# Patient Record
Sex: Male | Born: 1980 | Race: Black or African American | Hispanic: No | Smoking: Former smoker
Health system: Southern US, Community
[De-identification: ages and names within clinical notes are randomized; demographics above are authoritative.]

## PROBLEM LIST (undated history)

## (undated) ENCOUNTER — Ambulatory Visit (HOSPITAL_COMMUNITY): Admission: EM | Disposition: A | Discharge status: Home / Self Care | Payer: Self-pay

## (undated) DIAGNOSIS — J45909 Unspecified asthma, uncomplicated: Secondary | ICD-10-CM

---

## 2014-06-17 ENCOUNTER — Emergency Department (HOSPITAL_BASED_OUTPATIENT_CLINIC_OR_DEPARTMENT_OTHER)
Admission: EM | Admit: 2014-06-17 | Discharge: 2014-06-17 | Disposition: A | Discharge status: Home / Self Care | Payer: Self-pay | Attending: Emergency Medicine | Admitting: Emergency Medicine

## 2014-06-17 ENCOUNTER — Encounter (HOSPITAL_BASED_OUTPATIENT_CLINIC_OR_DEPARTMENT_OTHER): Payer: Self-pay | Admitting: Emergency Medicine

## 2014-06-17 DIAGNOSIS — K5900 Constipation, unspecified: Secondary | ICD-10-CM | POA: Insufficient documentation

## 2014-06-17 DIAGNOSIS — K6289 Other specified diseases of anus and rectum: Secondary | ICD-10-CM | POA: Insufficient documentation

## 2014-06-17 DIAGNOSIS — Z720 Tobacco use: Secondary | ICD-10-CM | POA: Insufficient documentation

## 2014-06-17 MED ORDER — HYDROCORTISONE ACETATE 25 MG RE SUPP
25.0000 mg | Freq: Two times a day (BID) | RECTAL | Status: DC
  Administered 2014-06-17: 25 mg via RECTAL
  Filled 2014-06-17: qty 1

## 2014-06-17 MED ORDER — HYDROCORTISONE ACETATE 25 MG RE SUPP: 25.0000 mg | Freq: Two times a day (BID) | RECTAL | Status: DC

## 2014-06-17 NOTE — ED Notes (Signed)
MD at bedside. 

## 2014-06-17 NOTE — ED Notes (Signed)
Pt states he noticed bump on anus area

## 2014-06-17 NOTE — Discharge Instructions (Signed)
Use of fiber one cereal and products as we discussed.   Constipation Constipation is when a person:  Poops (has a bowel movement) less than 3 times a week.  Has a hard time pooping.  Has poop that is dry, hard, or bigger than normal. HOME CARE   Eat foods with a lot of fiber in them. This includes fruits, vegetables, beans, and whole grains such as brown rice.  Avoid fatty foods and foods with a lot of sugar. This includes french fries, hamburgers, cookies, candy, and soda.  If you are not getting enough fiber from food, take products with added fiber in them (supplements).  Drink enough fluid to keep your pee (urine) clear or pale yellow.  Exercise on a regular basis, or as told by your doctor.  Go to the restroom when you feel like you need to poop. Do not hold it.  Only take medicine as told by your doctor. Do not take medicines that help you poop (laxatives) without talking to your doctor first. GET HELP RIGHT AWAY IF:   You have bright red blood in your poop (stool).  Your constipation lasts more than 4 days or gets worse.  You have belly (abdominal) or butt (rectal) pain.  You have thin poop (as thin as a pencil).  You lose weight, and it cannot be explained. MAKE SURE YOU:   Understand these instructions.  Will watch your condition.  Will get help right away if you are not doing well or get worse. Document Released: 06/21/2007 Document Revised: 01/07/2013 Document Reviewed: 10/14/2012 [facility] Patient Information 2015 [place], [place]. This information is not intended to replace advice given to you by your health care provider. Make sure you discuss any questions you have with your health care provider. High-Fiber Diet Fiber is found in fruits, vegetables, and grains. A high-fiber diet encourages the addition of more whole grains, legumes, fruits, and vegetables in your diet. The recommended amount of fiber for adult males is 38 g per day. For adult females, it is  25 g per day. Pregnant and lactating women should get 28 g of fiber per day. If you have a digestive or bowel problem, ask your caregiver for advice before adding high-fiber foods to your diet. Eat a variety of high-fiber foods instead of only a select few type of foods.  PURPOSE  To increase stool bulk.  To make bowel movements more regular to prevent constipation.  To lower cholesterol.  To prevent overeating. WHEN IS THIS DIET USED?  It may be used if you have constipation and hemorrhoids.  It may be used if you have uncomplicated diverticulosis (intestine condition) and irritable bowel syndrome.  It may be used if you need help with weight management.  It may be used if you want to add it to your diet as a protective measure against atherosclerosis, diabetes, and cancer. SOURCES OF FIBER  Whole-grain breads and cereals.  Fruits, such as apples, oranges, bananas, berries, prunes, and pears.  Vegetables, such as green peas, carrots, sweet potatoes, beets, broccoli, cabbage, spinach, and artichokes.  Legumes, such split peas, soy, lentils.  Almonds. FIBER CONTENT IN FOODS Starches and Grains / Dietary Fiber (g)  Cheerios, 1 cup / 3 g  Corn Flakes cereal, 1 cup / 0.7 g  Rice crispy treat cereal, 1 cup / 0.3 g  Instant oatmeal (cooked),  cup / 2 g  Frosted wheat cereal, 1 cup / 5.1 g  Brown, long-grain rice (cooked), 1 cup / 3.5 g  White, long-grain rice (cooked), 1 cup / 0.6 g  Enriched macaroni (cooked), 1 cup / 2.5 g Legumes / Dietary Fiber (g)  Baked beans (canned, plain, or vegetarian),  cup / 5.2 g  Kidney beans (canned),  cup / 6.8 g  Pinto beans (cooked),  cup / 5.5 g Breads and Crackers / Dietary Fiber (g)  Plain or honey graham crackers, 2 squares / 0.7 g  Saltine crackers, 3 squares / 0.3 g  Plain, salted pretzels, 10 pieces / 1.8 g  Whole-wheat bread, 1 slice / 1.9 g  White bread, 1 slice / 0.7 g  Raisin bread, 1 slice / 1.2  g  Plain bagel, 3 oz / 2 g  Flour tortilla, 1 oz / 0.9 g  Corn tortilla, 1 small / 1.5 g  Hamburger or hotdog bun, 1 small / 0.9 g Fruits / Dietary Fiber (g)  Apple with skin, 1 medium / 4.4 g  Sweetened applesauce,  cup / 1.5 g  Banana,  medium / 1.5 g  Grapes, 10 grapes / 0.4 g  Orange, 1 small / 2.3 g  Raisin, 1.5 oz / 1.6 g  Melon, 1 cup / 1.4 g Vegetables / Dietary Fiber (g)  Green beans (canned),  cup / 1.3 g  Carrots (cooked),  cup / 2.3 g  Broccoli (cooked),  cup / 2.8 g  Peas (cooked),  cup / 4.4 g  Mashed potatoes,  cup / 1.6 g  Lettuce, 1 cup / 0.5 g  Corn (canned),  cup / 1.6 g  Tomato,  cup / 1.1 g Document Released: 01/02/2005 Document Revised: 07/04/2011 Document Reviewed: 04/06/2011 ExitCare Patient Information 2015 [place]. This information is not intended to replace advice given to you by your health care provider. Make sure you discuss any questions you have with your health care provider.

## 2014-06-17 NOTE — ED Provider Notes (Signed)
CSN: [id number]     Arrival date & time 06/17/14  1434 History   First MD Initiated Contact with Patient 06/17/14 1504     Chief Complaint  Patient presents with  . Hemorrhoids     HPI Patient states that he's had some pain with defecation over the last few days.  Thought he felt a bump in his anus.  Denies any bleeding.  Otherwise healthy in has no past medical history is currently on no medications.  Patient states he has been straining in his stool has been very hard over the last few weeks. History reviewed. No pertinent past medical history. History reviewed. No pertinent past surgical history. History reviewed. No pertinent family history. History  Substance Use Topics  . Smoking status: Current Every Day Smoker  . Smokeless tobacco: Not on file  . Alcohol Use: No    Review of Systems  All other systems reviewed and are negative  Allergies  Review of patient's allergies indicates no known allergies.  Home Medications   Prior to Admission medications   Medication Sig Start Date End Date Taking? Authorizing Provider  hydrocortisone (ANUSOL-HC) 25 MG suppository Place 1 suppository (25 mg total) rectally 2 (two) times daily. 06/17/14   Nelva Nayobert Wren Pryce, MD   BP 123/65 mmHg  Pulse 67  Temp(Src) 98.5 F (36.9 C) (Oral)  Resp 18  Ht 5\' 8"  (1.727 m)  Wt 175 lb (79.379 kg)  BMI 26.61 kg/m2  SpO2 100% Physical Exam  Constitutional: He is oriented to person, place, and time. He appears well-developed and well-nourished. No distress.  HENT:  Head: Normocephalic and atraumatic.  Eyes: Pupils are equal, round, and reactive to light.  Neck: Normal range of motion.  Cardiovascular: Normal rate and intact distal pulses.   Pulmonary/Chest: No respiratory distress.  Abdominal: Normal appearance. He exhibits no distension.  Genitourinary: Rectal exam shows tenderness. Rectal exam shows no external hemorrhoid, no internal hemorrhoid, no fissure and no mass.  Musculoskeletal: Normal  range of motion.  Neurological: He is alert and oriented to person, place, and time. No cranial nerve deficit.  Skin: Skin is warm and dry. No rash noted.  Psychiatric: He has a normal mood and affect. His behavior is normal.  Nursing note and vitals reviewed.   ED Course  Procedures (including critical care time) Labs Review Labs Reviewed - No data to display    MDM   Final diagnoses:  Rectal pain  Constipation, unspecified constipation type        Nelva Nayobert Adarian Bur, MD 06/17/14 1534

## 2014-06-24 ENCOUNTER — Emergency Department (HOSPITAL_BASED_OUTPATIENT_CLINIC_OR_DEPARTMENT_OTHER)
Admission: EM | Admit: 2014-06-24 | Discharge: 2014-06-24 | Disposition: A | Discharge status: Home / Self Care | Payer: Self-pay | Attending: Emergency Medicine | Admitting: Emergency Medicine

## 2014-06-24 ENCOUNTER — Encounter (HOSPITAL_BASED_OUTPATIENT_CLINIC_OR_DEPARTMENT_OTHER): Payer: Self-pay | Admitting: *Deleted

## 2014-06-24 DIAGNOSIS — Z720 Tobacco use: Secondary | ICD-10-CM | POA: Insufficient documentation

## 2014-06-24 DIAGNOSIS — Z7952 Long term (current) use of systemic steroids: Secondary | ICD-10-CM | POA: Insufficient documentation

## 2014-06-24 DIAGNOSIS — L0231 Cutaneous abscess of buttock: Secondary | ICD-10-CM | POA: Insufficient documentation

## 2014-06-24 MED ORDER — HYDROCODONE-ACETAMINOPHEN 5-325 MG PO TABS: 2.0000 | ORAL_TABLET | ORAL | Status: DC | PRN

## 2014-06-24 MED ORDER — METRONIDAZOLE 500 MG PO TABS
500.0000 mg | ORAL_TABLET | Freq: Once | ORAL | Status: AC
  Administered 2014-06-24: 500 mg via ORAL
  Filled 2014-06-24: qty 1

## 2014-06-24 MED ORDER — DOXYCYCLINE HYCLATE 100 MG PO TABS
100.0000 mg | ORAL_TABLET | Freq: Once | ORAL | Status: AC
  Administered 2014-06-24: 100 mg via ORAL
  Filled 2014-06-24: qty 1

## 2014-06-24 MED ORDER — METRONIDAZOLE 500 MG PO TABS: 500.0000 mg | ORAL_TABLET | Freq: Two times a day (BID) | ORAL | Status: DC

## 2014-06-24 MED ORDER — DOXYCYCLINE HYCLATE 100 MG PO CAPS: 100.0000 mg | ORAL_CAPSULE | Freq: Two times a day (BID) | ORAL | Status: DC

## 2014-06-24 NOTE — Discharge Instructions (Signed)
Soak and gently massage the area in a bath or shower 3 times per day for 10-20 minutes to encourage additional drainage. Recheck here with any worsening pain failure to improve over the next several days.   Abscess An abscess is an infected area that contains a collection of pus and debris.It can occur in almost any part of the body. An abscess is also known as a furuncle or boil. CAUSES  An abscess occurs when tissue gets infected. This can occur from blockage of oil or sweat glands, infection of hair follicles, or a minor injury to the skin. As the body tries to fight the infection, pus collects in the area and creates pressure under the skin. This pressure causes pain. People with weakened immune systems have difficulty fighting infections and get certain abscesses more often.  SYMPTOMS Usually an abscess develops on the skin and becomes a painful mass that is red, warm, and tender. If the abscess forms under the skin, you may feel a moveable soft area under the skin. Some abscesses break open (rupture) on their own, but most will continue to get worse without care. The infection can spread deeper into the body and eventually into the bloodstream, causing you to feel ill.  DIAGNOSIS  Your caregiver will take your medical history and perform a physical exam. A sample of fluid may also be taken from the abscess to determine what is causing your infection. TREATMENT  Your caregiver may prescribe antibiotic medicines to fight the infection. However, taking antibiotics alone usually does not cure an abscess. Your caregiver may need to make a small cut (incision) in the abscess to drain the pus. In some cases, gauze is packed into the abscess to reduce pain and to continue draining the area. HOME CARE INSTRUCTIONS   Only take over-the-counter or prescription medicines for pain, discomfort, or fever as directed by your caregiver.  If you were prescribed antibiotics, take them as directed. Finish them  even if you start to feel better.  If gauze is used, follow your caregiver's directions for changing the gauze.  To avoid spreading the infection:  Keep your draining abscess covered with a bandage.  Wash your hands well.  Do not share personal care items, towels, or whirlpools with others.  Avoid skin contact with others.  Keep your skin and clothes clean around the abscess.  Keep all follow-up appointments as directed by your caregiver. SEEK MEDICAL CARE IF:   You have increased pain, swelling, redness, fluid drainage, or bleeding.  You have muscle aches, chills, or a general ill feeling.  You have a fever. MAKE SURE YOU:   Understand these instructions.  Will watch your condition.  Will get help right away if you are not doing well or get worse. Document Released: 10/12/2004 Document Revised: 07/04/2011 Document Reviewed: 03/17/2011 [facility] Patient Information 2015 [place], [place]. This information is not intended to replace advice given to you by your health care provider. Make sure you discuss any questions you have with your health care provider.

## 2014-06-24 NOTE — ED Provider Notes (Signed)
CSN: [id number]     Arrival date & time 06/24/14  1501 History   First MD Initiated Contact with Patient 06/24/14 1513     Chief Complaint  Patient presents with  . Rectal Pain      HPI  She presents for evaluation with some bleeding and rectal pain. Seen 1 week ago. Had some perirectal pain. No obvious external hemorrhoid noted. Was given Anusol suppositories and simple pain medication. He states he was getting better for several days. Few hours going on is that he was wet and short cephalic something" popped" noticed some blood and presents here. He states there is some "brownish yellow drainage also".  History reviewed. No pertinent past medical history. History reviewed. No pertinent past surgical history. No family history on file. History  Substance Use Topics  . Smoking status: Current Every Day Smoker  . Smokeless tobacco: Not on file  . Alcohol Use: No    Review of Systems  Constitutional: Negative for fever, chills, diaphoresis, appetite change and fatigue.  HENT: Negative for mouth sores, sore throat and trouble swallowing.   Eyes: Negative for visual disturbance.  Respiratory: Negative for cough, chest tightness, shortness of breath and wheezing.   Cardiovascular: Negative for chest pain.  Gastrointestinal: Negative for nausea, vomiting, abdominal pain, diarrhea and abdominal distention.  Endocrine: Negative for polydipsia, polyphagia and polyuria.  Genitourinary: Negative for dysuria, frequency and hematuria.       Rectal pain and bleeding  Musculoskeletal: Negative for gait problem.  Skin: Negative for color change, pallor and rash.  Neurological: Negative for dizziness, syncope, light-headedness and headaches.  Hematological: Does not bruise/bleed easily.  Psychiatric/Behavioral: Negative for behavioral problems and confusion.      Allergies  Review of patient's allergies indicates no known allergies.  Home Medications   Prior to Admission medications     Medication Sig Start Date End Date Taking? Authorizing Provider  doxycycline (VIBRAMYCIN) 100 MG capsule Take 1 capsule (100 mg total) by mouth 2 (two) times daily. 06/24/14   Rolland PorterMark Amjad Fikes, MD  HYDROcodone-acetaminophen (NORCO/VICODIN) 5-325 MG per tablet Take 2 tablets by mouth every 4 (four) hours as needed. 06/24/14   Rolland PorterMark Shakya Sebring, MD  hydrocortisone (ANUSOL-HC) 25 MG suppository Place 1 suppository (25 mg total) rectally 2 (two) times daily. 06/17/14   Nelva Nayobert Beaton, MD  metroNIDAZOLE (FLAGYL) 500 MG tablet Take 1 tablet (500 mg total) by mouth 2 (two) times daily. 06/24/14   Rolland PorterMark Linetta Regner, MD   BP 130/85 mmHg  Pulse 81  Temp(Src) 98.2 F (36.8 C) (Oral)  Resp 18  Ht 5\' 7"  (1.702 m)  Wt 175 lb (79.379 kg)  BMI 27.40 kg/m2  SpO2 99% Physical Exam  Constitutional: He is oriented to person, place, and time. He appears well-developed and well-nourished. No distress.  HENT:  Head: Normocephalic.  Eyes: Conjunctivae are normal. Pupils are equal, round, and reactive to light. No scleral icterus.  Neck: Normal range of motion. Neck supple. No thyromegaly present.  Cardiovascular: Normal rate and regular rhythm.  Exam reveals no gallop and no friction rub.   No murmur heard. Pulmonary/Chest: Effort normal and breath sounds normal. No respiratory distress. He has no wheezes. He has no rales.  Abdominal: Soft. Bowel sounds are normal. He exhibits no distension. There is no tenderness. There is no rebound.  Genitourinary:     Musculoskeletal: Normal range of motion.  Neurological: He is alert and oriented to person, place, and time.  Skin: Skin is warm and dry. No rash noted.  Psychiatric: He has a normal mood and affect. His behavior is normal.    ED Course  Procedures (including critical care time) Labs Review Labs Reviewed - No data to display  Imaging Review No results found.   EKG Interpretation None      MDM   Final diagnoses:  Abscess of buttock, right    Exam shows an area of  drainage was some minimal subcutaneous induration consistent with spontaneous rupture of the gluteal abscess. Encouraged him to continue to do warm soaks and gentle massage. Prescription for doxycycline and Flagyl. Recheck here if any failure to continue to improve.    Rolland Porter, MD 06/24/14 [phone]

## 2014-06-24 NOTE — ED Notes (Signed)
Pt c/o hemorrhoid pain x1 week. Pt was given suppositories and sts they were helping somewhat but he has ran out of them. He thinks one of them may have burst.

## 2015-02-05 ENCOUNTER — Encounter (HOSPITAL_COMMUNITY): Payer: Self-pay | Admitting: *Deleted

## 2015-02-05 ENCOUNTER — Emergency Department (HOSPITAL_COMMUNITY)
Admission: EM | Admit: 2015-02-05 | Discharge: 2015-02-05 | Disposition: A | Discharge status: Home / Self Care | Payer: Self-pay | Attending: Emergency Medicine | Admitting: Emergency Medicine

## 2015-02-05 ENCOUNTER — Emergency Department (HOSPITAL_COMMUNITY): Payer: Self-pay

## 2015-02-05 DIAGNOSIS — F17200 Nicotine dependence, unspecified, uncomplicated: Secondary | ICD-10-CM | POA: Insufficient documentation

## 2015-02-05 DIAGNOSIS — M940 Chondrocostal junction syndrome [Tietze]: Secondary | ICD-10-CM | POA: Insufficient documentation

## 2015-02-05 DIAGNOSIS — R001 Bradycardia, unspecified: Secondary | ICD-10-CM | POA: Insufficient documentation

## 2015-02-05 DIAGNOSIS — J209 Acute bronchitis, unspecified: Secondary | ICD-10-CM | POA: Insufficient documentation

## 2015-02-05 MED ORDER — PREDNISONE 20 MG PO TABS
40.0000 mg | ORAL_TABLET | Freq: Once | ORAL | Status: AC
  Administered 2015-02-05: 40 mg via ORAL
  Filled 2015-02-05: qty 2

## 2015-02-05 MED ORDER — IBUPROFEN 800 MG PO TABS: 800.0000 mg | ORAL_TABLET | Freq: Three times a day (TID) | ORAL | Status: DC

## 2015-02-05 MED ORDER — ALBUTEROL SULFATE HFA 108 (90 BASE) MCG/ACT IN AERS
2.0000 | INHALATION_SPRAY | Freq: Once | RESPIRATORY_TRACT | Status: AC
  Administered 2015-02-05: 2 via RESPIRATORY_TRACT
  Filled 2015-02-05: qty 6.7

## 2015-02-05 MED ORDER — IBUPROFEN 800 MG PO TABS
800.0000 mg | ORAL_TABLET | Freq: Once | ORAL | Status: AC
  Administered 2015-02-05: 800 mg via ORAL
  Filled 2015-02-05: qty 1

## 2015-02-05 MED ORDER — GUAIFENESIN-CODEINE 100-10 MG/5ML PO SOLN: 5.0000 mL | Freq: Three times a day (TID) | ORAL | Status: DC | PRN

## 2015-02-05 MED ORDER — PREDNISONE 20 MG PO TABS: 40.0000 mg | ORAL_TABLET | Freq: Every day | ORAL | Status: AC

## 2015-02-05 MED ORDER — IPRATROPIUM-ALBUTEROL 0.5-2.5 (3) MG/3ML IN SOLN
3.0000 mL | Freq: Once | RESPIRATORY_TRACT | Status: AC
  Administered 2015-02-05: 3 mL via RESPIRATORY_TRACT
  Filled 2015-02-05: qty 3

## 2015-02-05 NOTE — Discharge Instructions (Signed)
Chest Wall Pain Chest wall pain is pain in or around the bones and muscles of your chest. Sometimes, an injury causes this pain. Sometimes, the cause may not be known. This pain may take several weeks or longer to get better. HOME CARE INSTRUCTIONS  Pay attention to any changes in your symptoms. Take these actions to help with your pain:   Rest as told by your health care provider.   Avoid activities that cause pain. These include any activities that use your chest muscles or your abdominal and side muscles to lift heavy items.   If directed, apply ice to the painful area:  Put ice in a plastic bag.  Place a towel between your skin and the bag.  Leave the ice on for 20 minutes, 2-3 times per day.  Take over-the-counter and prescription medicines only as told by your health care provider.  Do not use tobacco products, including cigarettes, chewing tobacco, and e-cigarettes. If you need help quitting, ask your health care provider.  Keep all follow-up visits as told by your health care provider. This is important. SEEK MEDICAL CARE IF:  You have a fever.  Your chest pain becomes worse.  You have new symptoms. SEEK IMMEDIATE MEDICAL CARE IF:  You have nausea or vomiting.  You feel sweaty or light-headed.  You have a cough with phlegm (sputum) or you cough up blood.  You develop shortness of breath.   This information is not intended to replace advice given to you by your health care provider. Make sure you discuss any questions you have with your health care provider.   Document Released: 01/02/2005 Document Revised: 09/23/2014 Document Reviewed: 03/30/2014 Elsevier Interactive Patient Education 2016 [employer]  Costochondritis Costochondritis, sometimes called Tietze syndrome, is a swelling and irritation (inflammation) of the tissue (cartilage) that connects your ribs with your breastbone (sternum). It causes pain in the chest and rib area. Costochondritis usually  goes away on its own over time. It can take up to 6 weeks or longer to get better, especially if you are unable to limit your activities. CAUSES  Some cases of costochondritis have no known cause. Possible causes include:  Injury (trauma).  Exercise or activity such as lifting.  Severe coughing. SIGNS AND SYMPTOMS  Pain and tenderness in the chest and rib area.  Pain that gets worse when coughing or taking deep breaths.  Pain that gets worse with specific movements. DIAGNOSIS  Your health care provider will do a physical exam and ask about your symptoms. Chest X-rays or other tests may be done to rule out other problems. TREATMENT  Costochondritis usually goes away on its own over time. Your health care provider may prescribe medicine to help relieve pain. HOME CARE INSTRUCTIONS   Avoid exhausting physical activity. Try not to strain your ribs during normal activity. This would include any activities using chest, abdominal, and side muscles, especially if heavy weights are used.  Apply ice to the affected area for the first 2 days after the pain begins.  Put ice in a plastic bag.  Place a towel between your skin and the bag.  Leave the ice on for 20 minutes, 2-3 times a day.  Only take over-the-counter or prescription medicines as directed by your health care provider. SEEK MEDICAL CARE IF:  You have redness or swelling at the rib joints. These are signs of infection.  Your pain does not go away despite rest or medicine. SEEK IMMEDIATE MEDICAL CARE IF:   Your pain  increases or you are very uncomfortable.  You have shortness of breath or difficulty breathing.  You cough up blood.  You have worse chest pains, sweating, or vomiting.  You have a fever or persistent symptoms for more than 2-3 days.  You have a fever and your symptoms suddenly get worse. MAKE SURE YOU:   Understand these instructions.  Will watch your condition.  Will get help right away if you are  not doing well or get worse.   This information is not intended to replace advice given to you by your health care provider. Make sure you discuss any questions you have with your health care provider.   Document Released: 10/12/2004 Document Revised: 10/23/2012 Document Reviewed: 08/06/2012 Elsevier Interactive Patient Education 2016 [employer]  Acute Bronchitis Bronchitis is inflammation of the airways that extend from the windpipe into the lungs (bronchi). The inflammation often causes mucus to develop. This leads to a cough, which is the most common symptom of bronchitis.  In acute bronchitis, the condition usually develops suddenly and goes away over time, usually in a couple weeks. Smoking, allergies, and asthma can make bronchitis worse. Repeated episodes of bronchitis may cause further lung problems.  CAUSES Acute bronchitis is most often caused by the same virus that causes a cold. The virus can spread from person to person (contagious) through coughing, sneezing, and touching contaminated objects. SIGNS AND SYMPTOMS   Cough.   Fever.   Coughing up mucus.   Body aches.   Chest congestion.   Chills.   Shortness of breath.   Sore throat.  DIAGNOSIS  Acute bronchitis is usually diagnosed through a physical exam. Your health care provider will also ask you questions about your medical history. Tests, such as chest X-rays, are sometimes done to rule out other conditions.  TREATMENT  Acute bronchitis usually goes away in a couple weeks. Oftentimes, no medical treatment is necessary. Medicines are sometimes given for relief of fever or cough. Antibiotic medicines are usually not needed but may be prescribed in certain situations. In some cases, an inhaler may be recommended to help reduce shortness of breath and control the cough. A cool mist vaporizer may also be used to help thin bronchial secretions and make it easier to clear the chest.  HOME CARE  INSTRUCTIONS  Get plenty of rest.   Drink enough fluids to keep your urine clear or pale yellow (unless you have a medical condition that requires fluid restriction). Increasing fluids may help thin your respiratory secretions (sputum) and reduce chest congestion, and it will prevent dehydration.   Take medicines only as directed by your health care provider.  If you were prescribed an antibiotic medicine, finish it all even if you start to feel better.  Avoid smoking and secondhand smoke. Exposure to cigarette smoke or irritating chemicals will make bronchitis worse. If you are a smoker, consider using nicotine gum or skin patches to help control withdrawal symptoms. Quitting smoking will help your lungs heal faster.   Reduce the chances of another bout of acute bronchitis by washing your hands frequently, avoiding people with cold symptoms, and trying not to touch your hands to your mouth, nose, or eyes.   Keep all follow-up visits as directed by your health care provider.  SEEK MEDICAL CARE IF: Your symptoms do not improve after 1 week of treatment.  SEEK IMMEDIATE MEDICAL CARE IF:  You develop an increased fever or chills.   You have chest pain.   You have severe shortness  of breath.  You have bloody sputum.   You develop dehydration.  You faint or repeatedly feel like you are going to pass out.  You develop repeated vomiting.  You develop a severe headache. MAKE SURE YOU:   Understand these instructions.  Will watch your condition.  Will get help right away if you are not doing well or get worse.   This information is not intended to replace advice given to you by your health care provider. Make sure you discuss any questions you have with your health care provider.   Document Released: 02/10/2004 Document Revised: 01/23/2014 Document Reviewed: 06/25/2012 Elsevier Interactive Patient Education [employer]

## 2015-02-05 NOTE — ED Notes (Signed)
Pt reports a soreness to chest wall, increases on palpation and when smoking. Also having recent cough. No acute distress noted at triage.

## 2015-02-05 NOTE — ED Provider Notes (Signed)
CSN: [id number]     Arrival date & time 02/05/15  1800 History   First MD Initiated Contact with Patient 02/05/15 2206     Chief Complaint  Patient presents with  . Cough  . Chest Pain     (Consider location/radiation/quality/duration/timing/severity/associated sxs/prior Treatment) HPI   Pt is a 35 year old male who presents to the ER with 4 days of productive cough, with yellow sputum, worse at night, with associated wheeze, chest tightness and central chest "soreness," which is worse at night.  CP is worse with coughing and with palpation.  Pt is a current smoker, states 6 months ago he began smoking more heavily, 1 ppd, and he frequently smokes marijuana.  He denies fevers, chills, sweats, hemoptysis, SOB, URI sx, diaphoresis, N, V, D, lightheadedness, syncope, palpitations, LE edema, orthopnea.  Pt denies cardiac hx, denies family cardiac hx.  No recently travel.  History reviewed. No pertinent past medical history. History reviewed. No pertinent past surgical history. History reviewed. No pertinent family history. Social History  Substance Use Topics  . Smoking status: Current Every Day Smoker  . Smokeless tobacco: None  . Alcohol Use: No    Review of Systems  All other systems reviewed and are negative.     Allergies  Shellfish allergy  Home Medications   Prior to Admission medications   Medication Sig Start Date End Date Taking? Authorizing Provider  guaiFENesin-codeine 100-10 MG/5ML syrup Take 5 mLs by mouth 3 (three) times daily as needed for cough. 02/05/15   Danelle Berry, PA-C  ibuprofen (ADVIL,MOTRIN) 800 MG tablet Take 1 tablet (800 mg total) by mouth 3 (three) times daily. 02/05/15   Danelle Berry, PA-C  predniSONE (DELTASONE) 20 MG tablet Take 2 tablets (40 mg total) by mouth daily. 02/06/15 02/09/15  Danelle Berry, PA-C   BP 122/92 mmHg  Pulse 48  Temp(Src) 98.3 F (36.8 C) (Oral)  Resp 13  SpO2 97% Physical Exam  Constitutional: He is oriented to person,  place, and time. He appears well-developed and well-nourished. He is cooperative.  Non-toxic appearance. He does not have a sickly appearance. No distress.  HENT:  Head: Normocephalic and atraumatic.  Nose: Nose normal.  Mouth/Throat: Oropharynx is clear and moist. No oropharyngeal exudate.  Eyes: Conjunctivae and EOM are normal. Pupils are equal, round, and reactive to light. Right eye exhibits no discharge. Left eye exhibits no discharge. No scleral icterus.  Neck: Normal range of motion. Neck supple. No JVD present. No tracheal deviation present. No thyromegaly present.  Cardiovascular: Regular rhythm, normal heart sounds, intact distal pulses and normal pulses.  Bradycardia present.  Exam reveals no gallop and no friction rub.   No murmur heard. Pulses:      Radial pulses are 2+ on the right side, and 2+ on the left side.       Dorsalis pedis pulses are 2+ on the right side, and 2+ on the left side.  Symmetrical pulses radial 2+, DP 2+, no LE edema, ttp to anterior chest wall  Pulmonary/Chest: Effort normal and breath sounds normal. No respiratory distress. He has no wheezes. He has no rales. He exhibits tenderness.  Frequent cough  Abdominal: Soft. Bowel sounds are normal. He exhibits no distension and no mass. There is no tenderness. There is no rebound and no guarding.  Musculoskeletal: Normal range of motion. He exhibits no edema or tenderness.  Lymphadenopathy:    He has no cervical adenopathy.  Neurological: He is alert and oriented to person, place, and  time. He has normal reflexes. No cranial nerve deficit. He exhibits normal muscle tone. Coordination normal.  Skin: Skin is warm and dry. No rash noted. He is not diaphoretic. No erythema. No pallor.  Psychiatric: He has a normal mood and affect. His behavior is normal. Judgment and thought content normal.  Nursing note and vitals reviewed.   ED Course  Procedures (including critical care time) Labs Review Labs Reviewed - No  data to display  Imaging Review Dg Chest 2 View  02/05/2015  CLINICAL DATA:  Productive cough for 4 days EXAM: CHEST - 2 VIEW COMPARISON:  None. FINDINGS: Lungs are well aerated bilaterally. No focal infiltrate or sizable effusion is noted. Mild increased interstitial changes are noted. The bony structures are within normal limits. IMPRESSION: Mild increased interstitial changes which may be related to mild bronchitis. No focal confluent infiltrate is seen. Electronically Signed   By: Alcide Clever M.D.   On: 02/05/2015 18:43   I have personally reviewed and evaluated these images and lab results as part of my medical decision-making.   EKG Interpretation   Date/Time:  Friday February 05 2015 21:44:21 EST Ventricular Rate:  48 PR Interval:  148 QRS Duration: 83 QT Interval:  419 QTC Calculation: 374 R Axis:   80 Text Interpretation:  Sinus bradycardia ST elev, probable normal early  repol pattern No previous ECGs available Confirmed by YAO  MD, DAVID  ([id number]) on 02/05/2015 9:51:48 PM      MDM   Pt with 4 days "dry" cough with yellow sputum and wheeze and sore chest over sternum, reproducible.  Pt is a "heavy smoker", denies recent URI sx, denies exertional CP, not worse with inspiration, no fever, chills, sweats, no hemoptysis.  He denies palpitations, diaphoresis, exertional SOB, orthopnea, PND, near syncope.  No Hx of DM, HTN, no family cardiac hx.  Sx not suspicious for ACS.  Xray negative for aveolar infiltrate, but positive for increased interstitial changes, consistent with possible bronchitis. Pt treated in the ER with breathing tx, steroids, given NSAIDs.  Reported improved chest tightness.   Medications  ipratropium-albuterol (DUONEB) 0.5-2.5 (3) MG/3ML nebulizer solution 3 mL (3 mLs Nebulization Given 02/05/15 2251)  predniSONE (DELTASONE) tablet 40 mg (40 mg Oral Given 02/05/15 2251)  albuterol (PROVENTIL HFA;VENTOLIN HFA) 108 (90 Base) MCG/ACT inhaler 2 puff (2 puffs  Inhalation Given 02/05/15 2335)  ibuprofen (ADVIL,MOTRIN) tablet 800 mg (800 mg Oral Given 02/05/15 2335)   Pt's vital signs stable, pt bradycardic at rest, asymptomatic.   Discharged with albuterol inhaler, 5 day burst of steroids (today 1/5), and NSAIDS.  Encouraged smoking cessation.  Return precautions reviewed.  Pt discharged in stable and satisfactory condition. Filed Vitals:   02/05/15 2315 02/05/15 2335  BP: 122/92   Pulse: 48   Temp:  98.3 F (36.8 C)  Resp: 13      Final diagnoses:  Acute bronchitis, unspecified organism  Costochondritis       Danelle Berry, PA-C 02/06/15 1011  Richardean Canal, MD 02/08/15 [phone]

## 2016-01-24 ENCOUNTER — Emergency Department (HOSPITAL_BASED_OUTPATIENT_CLINIC_OR_DEPARTMENT_OTHER)
Admission: EM | Admit: 2016-01-24 | Discharge: 2016-01-24 | Disposition: A | Discharge status: Home / Self Care | Payer: Self-pay | Attending: Emergency Medicine | Admitting: Emergency Medicine

## 2016-01-24 ENCOUNTER — Encounter (HOSPITAL_BASED_OUTPATIENT_CLINIC_OR_DEPARTMENT_OTHER): Payer: Self-pay

## 2016-01-24 DIAGNOSIS — F17200 Nicotine dependence, unspecified, uncomplicated: Secondary | ICD-10-CM | POA: Insufficient documentation

## 2016-01-24 DIAGNOSIS — L723 Sebaceous cyst: Secondary | ICD-10-CM | POA: Insufficient documentation

## 2016-01-24 MED ORDER — CEPHALEXIN 500 MG PO CAPS: 500.0000 mg | ORAL_CAPSULE | Freq: Four times a day (QID) | ORAL | 0 refills | Status: DC

## 2016-01-24 NOTE — Discharge Instructions (Signed)
Take the prescribed medication as directed.  May wish to try warm compresses at home to help with healing. Follow-up with the wellness clinic.  You may also follow-up with the ENT if this does not seem to be improving. Return to the ED for new or worsening symptoms.

## 2016-01-24 NOTE — ED Provider Notes (Signed)
[facility] Provider Note   CSN: [id number] Arrival date & time: 01/24/16  1150     History   Chief Complaint Chief Complaint  Patient presents with  . Ear Problem    HPI Anthony Christian is a 36 y.o. male.  The history is provided by the patient.     36 y.o. M here with "bump" behind left ear.  States he first noticed this 3 weeks ago.  States has increased in size slightly but has become more painful and irritated.  States no pain inside the ear itself.  No hearing issues.  No fever, no chills.  Has not tried any intervention PTA.    History reviewed. No pertinent past medical history.  There are no active problems to display for this patient.   History reviewed. No pertinent surgical history.     Home Medications    Prior to Admission medications   Not on File    Family History No family history on file.  Social History Social History  Substance Use Topics  . Smoking status: Current Every Day Smoker  . Smokeless tobacco: Never Used  . Alcohol use No     Allergies   Shellfish allergy   Review of Systems Review of Systems  HENT: Positive for ear pain.   All other systems reviewed and are negative.    Physical Exam Updated Vital Signs BP 139/59 (BP Location: Right Arm)   Pulse 75   Temp 98.2 F (36.8 C) (Oral)   Resp 18   Ht 5\' 8"  (1.727 m)   Wt 83.9 kg   SpO2 100%   BMI 28.13 kg/m   Physical Exam  Constitutional: He is oriented to person, place, and time. He appears well-developed and well-nourished.  HENT:  Head: Normocephalic and atraumatic.  Mouth/Throat: Oropharynx is clear and moist.  Small, inflamed sebaceous cyst behind the left ear which is TTP and abutting the cartilage of posterior ear; there is no surrounding erythema or induration; no mastoid tenderness; TM's are both normal  Eyes: Conjunctivae and EOM are normal. Pupils are equal, round, and reactive to light.  Neck: Normal range of motion.  Cardiovascular:  Normal rate, regular rhythm and normal heart sounds.   Pulmonary/Chest: Effort normal and breath sounds normal.  Abdominal: Soft. Bowel sounds are normal.  Musculoskeletal: Normal range of motion.  Neurological: He is alert and oriented to person, place, and time.  Skin: Skin is warm and dry.  Psychiatric: He has a normal mood and affect.  Nursing note and vitals reviewed.    ED Treatments / Results  Labs (all labs ordered are listed, but only abnormal results are displayed) Labs Reviewed - No data to display  EKG  EKG Interpretation None       Radiology No results found.  Procedures Procedures (including critical care time)  Medications Ordered in ED Medications - No data to display   Initial Impression / Assessment and Plan / ED Course  I have reviewed the triage vital signs and the nursing notes.  Pertinent labs & imaging results that were available during my care of the patient were reviewed by me and considered in my medical decision making (see chart for details).  Clinical Course    36 y.o. M here with what appears to be inflamed sebaceous cyst behind left ear.  This is abutting the cartilage of the left posterior ear.  No apparent complicating features such as facial cellulitis or mastoiditis at this time.  Given close proximity  to cartilage of the ear, am hesitant to attempt I&D.  Will start on abx, have patient perform warm compresses.  He does not currently have PCP or insurance, so was given follow-up with wellness clinic as well as ENT.  Discussed plan with patient, he acknowledged understanding and agreed with plan of care.  Return precautions given for new or worsening symptoms.  Final Clinical Impressions(s) / ED Diagnoses   Final diagnoses:  Sebaceous cyst of ear    New Prescriptions New Prescriptions   CEPHALEXIN (KEFLEX) 500 MG CAPSULE    Take 1 capsule (500 mg total) by mouth 4 (four) times daily.     Garlon HatchetLisa M Holden Draughon, PA-C 01/24/16 1223      Benjiman CoreNathan Pickering, MD 01/24/16 [phone]

## 2016-01-24 NOTE — ED Triage Notes (Signed)
C/o "bump behind" left ear x 3 weeks-NAD- steady gait

## 2016-07-25 ENCOUNTER — Encounter (HOSPITAL_COMMUNITY): Payer: Self-pay | Admitting: Emergency Medicine

## 2016-07-25 DIAGNOSIS — F17200 Nicotine dependence, unspecified, uncomplicated: Secondary | ICD-10-CM | POA: Insufficient documentation

## 2016-07-25 DIAGNOSIS — L0231 Cutaneous abscess of buttock: Secondary | ICD-10-CM | POA: Insufficient documentation

## 2016-07-25 DIAGNOSIS — Z79899 Other long term (current) drug therapy: Secondary | ICD-10-CM | POA: Insufficient documentation

## 2016-07-25 MED ORDER — OXYCODONE-ACETAMINOPHEN 5-325 MG PO TABS
1.0000 | ORAL_TABLET | ORAL | Status: DC | PRN
  Administered 2016-07-25: 1 via ORAL

## 2016-07-25 MED ORDER — OXYCODONE-ACETAMINOPHEN 5-325 MG PO TABS
ORAL_TABLET | ORAL | Status: AC
  Filled 2016-07-25: qty 1

## 2016-07-25 NOTE — ED Triage Notes (Signed)
Reports a boil in the upper part of but crack for the last two days.  Denies having any fevers.

## 2016-07-26 ENCOUNTER — Emergency Department (HOSPITAL_COMMUNITY)
Admission: EM | Admit: 2016-07-26 | Discharge: 2016-07-26 | Disposition: A | Discharge status: Home / Self Care | Payer: Self-pay | Attending: Emergency Medicine | Admitting: Emergency Medicine

## 2016-07-26 DIAGNOSIS — L0291 Cutaneous abscess, unspecified: Secondary | ICD-10-CM

## 2016-07-26 LAB — CBG MONITORING, ED: Glucose-Capillary: 85 mg/dL (ref 65–99)

## 2016-07-26 MED ORDER — SULFAMETHOXAZOLE-TRIMETHOPRIM 800-160 MG PO TABS: 1.0000 | ORAL_TABLET | Freq: Two times a day (BID) | ORAL | 0 refills | Status: AC

## 2016-07-26 NOTE — Discharge Instructions (Signed)
1. Medications: bactrim, usual home medications 2. Treatment: rest, drink plenty of fluids, use warm compresses,  3. Follow Up: Please followup with your primary doctor in 2-3 days for discussion of your diagnoses and further evaluation after today's visit; if you do not have a primary care doctor use the resource guide provided to find one; Please return to the ER for fevers, chills, nausea, vomiting or other signs of infection

## 2016-07-26 NOTE — ED Provider Notes (Signed)
[facility]-EMER[facility]Y DEPT Provider Note   CSN: [id number] Arrival date & time: 07/25/16  2326     History   Chief Complaint Chief Complaint  Patient presents with  . Abscess    HPI Anthony Christian is a 36 y.o. male with no major medical problems presents to the Emergency Department complaining of gradual, persistent, progressively worsening perirectal abscess onset 2 days ago.  Pt reports hx of same approx 6 mos ago with spontaneous rupture.  Pt reports he decided to be seen earlier for his symptoms this time.  He reports warm water soaks without improvement.  He denies fever, chills, abd pain, N/V, bloody bowel movements or painful bowel movements.  He denies constipation.  Pt also denies hx of diabetes, HIV, IVDU or anal sex. Sitting makes his symptoms worse and nothing makes it better.  Pt reports he shaves regularly with clippers.  The history is provided by the patient and medical records. No language interpreter was used.    History reviewed. No pertinent past medical history.  There are no active problems to display for this patient.   History reviewed. No pertinent surgical history.     Home Medications    Prior to Admission medications   Medication Sig Start Date End Date Taking? Authorizing Provider  cephALEXin (KEFLEX) 500 MG capsule Take 1 capsule (500 mg total) by mouth 4 (four) times daily. 01/24/16   Garlon Hatchet, PA-C  sulfamethoxazole-trimethoprim (BACTRIM DS,SEPTRA DS) 800-160 MG tablet Take 1 tablet by mouth 2 (two) times daily. 07/26/16 08/02/16  Valli Randol, Dahlia Client, PA-C    Family History No family history on file.  Social History Social History  Substance Use Topics  . Smoking status: Current Every Day Smoker  . Smokeless tobacco: Never Used  . Alcohol use No     Allergies   Shellfish allergy   Review of Systems Review of Systems  Constitutional: Negative for appetite change, diaphoresis, fatigue, fever and unexpected weight change.  HENT:  Negative for mouth sores.   Eyes: Negative for visual disturbance.  Respiratory: Negative for cough, chest tightness, shortness of breath and wheezing.   Cardiovascular: Negative for chest pain.  Gastrointestinal: Negative for abdominal pain, constipation, diarrhea, nausea and vomiting.  Endocrine: Negative for polydipsia, polyphagia and polyuria.  Genitourinary: Negative for dysuria, frequency, hematuria and urgency.  Musculoskeletal: Negative for back pain and neck stiffness.  Skin: Negative for rash.       Abscess  Allergic/Immunologic: Negative for immunocompromised state.  Neurological: Negative for syncope, light-headedness and headaches.  Hematological: Does not bruise/bleed easily.  Psychiatric/Behavioral: Negative for sleep disturbance. The patient is not nervous/anxious.   All other systems reviewed and are negative.    Physical Exam Updated Vital Signs BP 111/64   Pulse (!) 52   Temp 98.3 F (36.8 C) (Oral)   Resp 16   Ht 5\' 8"  (1.727 m)   Wt 90.7 kg (200 lb)   SpO2 99%   BMI 30.41 kg/m   Physical Exam  Constitutional: He is oriented to person, place, and time. He appears well-developed and well-nourished. No distress.  HENT:  Head: Normocephalic and atraumatic.  Eyes: Conjunctivae are normal. No scleral icterus.  Neck: Normal range of motion.  Cardiovascular: Normal rate, regular rhythm and intact distal pulses.   Pulmonary/Chest: Effort normal and breath sounds normal.  Abdominal: Soft. He exhibits no distension. There is no tenderness.  Genitourinary: Rectal exam shows no fissure, no mass and no tenderness.     Lymphadenopathy:    He  has no cervical adenopathy.  Neurological: He is alert and oriented to person, place, and time.  Skin: Skin is warm and dry. He is not diaphoretic. There is erythema.  Psychiatric: He has a normal mood and affect.  Nursing note and vitals reviewed.    ED Treatments / Results  Labs (all labs ordered are listed, but only  abnormal results are displayed) Labs Reviewed  CBG MONITORING, ED    Procedures Procedures (including critical care time)  EMERGENCY DEPARTMENT US SOFT TISSUE INTERPRETATION "Study: Limited Soft Tissue Ultrasound"  INDICATIONS: Pain and Soft tissue infection Multiple views of the body part were obtained in real-time with a multi-frequency linear probe  PERFORMED BY: Myself IMAGES ARCHIVED?: Yes SIDE:Right  BODY PART:buttock INTERPRETATION:  Abcess present     Medications Ordered in ED Medications  oxyCODONE-acetaminophen (PERCOCET/ROXICET) 5-325 MG per tablet 1 tablet (1 tablet Oral Given 07/25/16 2347)  oxyCODONE-acetaminophen (PERCOCET/ROXICET) 5-325 MG per tablet (not administered)     Initial Impression / Assessment and Plan / ED Course  I have reviewed the triage vital signs and the nursing notes.  Pertinent labs & imaging results that were available during my care of the patient were reviewed by me and considered in my medical decision making (see chart for details).     Pt with small abscess to the right buttock without clinical evidence of extension into the rectum.  No TTP along the anal opening.  US with small fluid collection.  Pt does not wish to have this drained.  Pt without known risk factors for recurrent abscess including no IVDU, HIV, MSM or anal sex.  Pt offered I&D and refuses at this time, requesting abx only.  CBG is 85.  No evidence of diabetes and no hx of same.    Final Clinical Impressions(s) / ED Diagnoses   Final diagnoses:  Abscess    New Prescriptions New Prescriptions   SULFAMETHOXAZOLE-TRIMETHOPRIM (BACTRIM DS,SEPTRA DS) 800-160 MG TABLET    Take 1 tablet by mouth 2 (two) times daily.     Bandy Honaker, Boyd KerbsHannah, PA-C 07/26/16 [phone]    Derwood KaplanNanavati, Ankit, MD 07/26/16 [phone]

## 2018-01-06 ENCOUNTER — Other Ambulatory Visit: Payer: Self-pay

## 2018-01-06 ENCOUNTER — Emergency Department (HOSPITAL_COMMUNITY)
Admission: EM | Admit: 2018-01-06 | Discharge: 2018-01-06 | Disposition: A | Discharge status: Home / Self Care | Payer: No Typology Code available for payment source | Attending: Emergency Medicine | Admitting: Emergency Medicine

## 2018-01-06 ENCOUNTER — Emergency Department (HOSPITAL_COMMUNITY): Payer: No Typology Code available for payment source

## 2018-01-06 ENCOUNTER — Encounter (HOSPITAL_COMMUNITY): Payer: Self-pay

## 2018-01-06 DIAGNOSIS — S82891A Other fracture of right lower leg, initial encounter for closed fracture: Secondary | ICD-10-CM

## 2018-01-06 DIAGNOSIS — S8261XA Displaced fracture of lateral malleolus of right fibula, initial encounter for closed fracture: Secondary | ICD-10-CM | POA: Insufficient documentation

## 2018-01-06 DIAGNOSIS — Y92410 Unspecified street and highway as the place of occurrence of the external cause: Secondary | ICD-10-CM | POA: Insufficient documentation

## 2018-01-06 DIAGNOSIS — S93402A Sprain of unspecified ligament of left ankle, initial encounter: Secondary | ICD-10-CM | POA: Diagnosis not present

## 2018-01-06 DIAGNOSIS — R1030 Lower abdominal pain, unspecified: Secondary | ICD-10-CM | POA: Insufficient documentation

## 2018-01-06 DIAGNOSIS — S80211A Abrasion, right knee, initial encounter: Secondary | ICD-10-CM | POA: Insufficient documentation

## 2018-01-06 DIAGNOSIS — M25331 Other instability, right wrist: Secondary | ICD-10-CM | POA: Diagnosis not present

## 2018-01-06 DIAGNOSIS — S52514A Nondisplaced fracture of right radial styloid process, initial encounter for closed fracture: Secondary | ICD-10-CM | POA: Diagnosis not present

## 2018-01-06 DIAGNOSIS — Y999 Unspecified external cause status: Secondary | ICD-10-CM | POA: Insufficient documentation

## 2018-01-06 DIAGNOSIS — Y9389 Activity, other specified: Secondary | ICD-10-CM | POA: Diagnosis not present

## 2018-01-06 DIAGNOSIS — R918 Other nonspecific abnormal finding of lung field: Secondary | ICD-10-CM | POA: Insufficient documentation

## 2018-01-06 DIAGNOSIS — S99911A Unspecified injury of right ankle, initial encounter: Secondary | ICD-10-CM | POA: Diagnosis present

## 2018-01-06 DIAGNOSIS — F17200 Nicotine dependence, unspecified, uncomplicated: Secondary | ICD-10-CM | POA: Diagnosis not present

## 2018-01-06 DIAGNOSIS — S63511A Sprain of carpal joint of right wrist, initial encounter: Secondary | ICD-10-CM | POA: Diagnosis not present

## 2018-01-06 DIAGNOSIS — S6291XA Unspecified fracture of right wrist and hand, initial encounter for closed fracture: Secondary | ICD-10-CM

## 2018-01-06 LAB — I-STAT CHEM 8, ED
BUN: 10 mg/dL (ref 6–20)
Calcium, Ion: 1.11 mmol/L — ABNORMAL LOW (ref 1.15–1.40)
Chloride: 108 mmol/L (ref 98–111)
Creatinine, Ser: 1 mg/dL (ref 0.61–1.24)
Glucose, Bld: 135 mg/dL — ABNORMAL HIGH (ref 70–99)
HCT: 49 % (ref 39.0–52.0)
Hemoglobin: 16.7 g/dL (ref 13.0–17.0)
Potassium: 3.5 mmol/L (ref 3.5–5.1)
Sodium: 142 mmol/L (ref 135–145)
TCO2: 22 mmol/L (ref 22–32)

## 2018-01-06 LAB — COMPREHENSIVE METABOLIC PANEL
ALT: 20 U/L (ref 0–44)
AST: 22 U/L (ref 15–41)
Albumin: 4 g/dL (ref 3.5–5.0)
Alkaline Phosphatase: 64 U/L (ref 38–126)
Anion gap: 9 (ref 5–15)
BILIRUBIN TOTAL: 0.9 mg/dL (ref 0.3–1.2)
BUN: 8 mg/dL (ref 6–20)
CO2: 22 mmol/L (ref 22–32)
Calcium: 9.1 mg/dL (ref 8.9–10.3)
Chloride: 110 mmol/L (ref 98–111)
Creatinine, Ser: 0.91 mg/dL (ref 0.61–1.24)
GFR calc Af Amer: >60 mL/min (ref >60)
GFR calc non Af Amer: >60 mL/min (ref >60)
Glucose, Bld: 121 mg/dL — ABNORMAL HIGH (ref 70–99)
Potassium: 3.7 mmol/L (ref 3.5–5.1)
Sodium: 141 mmol/L (ref 135–145)
Total Protein: 7.3 g/dL (ref 6.5–8.1)

## 2018-01-06 LAB — ETHANOL: Alcohol, Ethyl (B): <10 mg/dL (ref <10)

## 2018-01-06 LAB — CBC
HCT: 48.8 % (ref 39.0–52.0)
Hemoglobin: 15.9 g/dL (ref 13.0–17.0)
MCH: 31.9 pg (ref 26.0–34.0)
MCHC: 32.6 g/dL (ref 30.0–36.0)
MCV: 98 fL (ref 80.0–100.0)
Platelets: 258 10*3/uL (ref 150–400)
RBC: 4.98 MIL/uL (ref 4.22–5.81)
RDW: 12.5 % (ref 11.5–15.5)
WBC: 9.5 10*3/uL (ref 4.0–10.5)
nRBC: 0 % (ref 0.0–0.2)

## 2018-01-06 LAB — CDS SEROLOGY

## 2018-01-06 LAB — SAMPLE TO BLOOD BANK

## 2018-01-06 LAB — PROTIME-INR
INR: 0.95
Prothrombin Time: 12.6 seconds (ref 11.4–15.2)

## 2018-01-06 LAB — I-STAT CG4 LACTIC ACID, ED: Lactic Acid, Venous: 1.74 mmol/L (ref 0.5–1.9)

## 2018-01-06 MED ORDER — HYDROCODONE-ACETAMINOPHEN 5-325 MG PO TABS
1.0000 | ORAL_TABLET | Freq: Once | ORAL | Status: AC
  Administered 2018-01-06: 1 via ORAL
  Filled 2018-01-06: qty 1

## 2018-01-06 MED ORDER — IOHEXOL 300 MG/ML  SOLN
100.0000 mL | Freq: Once | INTRAMUSCULAR | Status: AC | PRN
  Administered 2018-01-06: 100 mL via INTRAVENOUS

## 2018-01-06 MED ORDER — HYDROCODONE-ACETAMINOPHEN 5-325 MG PO TABS: 1.0000 | ORAL_TABLET | Freq: Four times a day (QID) | ORAL | 0 refills | Status: DC | PRN
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
## 2018-01-06 NOTE — Discharge Instructions (Addendum)
Use Tylenol and Motrin for pain.  Use hydrocodone if breakthrough pain.  Do not bear any weight with the right upper extremity, okay to bear weight in the right lower extremity if walking boot is being used otherwise be nonweightbearing

## 2018-01-06 NOTE — Progress Notes (Signed)
Orthopedic Tech Progress Note Patient Details:  Marcos EkeCedrick Biondolillo December 23, 1980 [id number]  Ortho Devices Type of Ortho Device: Ankle Air splint, CAM walker, Crutches, Thumb spica splint Splint Material: Fiberglass Ortho Device/Splint Location: rue thumb spica, rle cam walker, lle aircast Ortho Device/Splint Interventions: Ordered, Application, Adjustment   Post Interventions Patient Tolerated: Well Instructions Provided: Care of device, Adjustment of device   Trinna PostMartinez, Shelbia Scinto J 01/06/2018, 6:30 PM

## 2018-01-06 NOTE — ED Notes (Signed)
Blood tubed to main lab, and blood bank. 
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
## 2018-01-06 NOTE — Progress Notes (Signed)
   01/06/18 1400  Clinical Encounter Type  Visited With Patient  Visit Type Initial;Psychological support;Spiritual support;ED  Referral From Nurse  Consult/Referral To Chaplain  Spiritual Encounters  Spiritual Needs Emotional;Other (Comment) (Spiritual Care conversation/support)  Stress Factors  Patient Stress Factors None identified  Advance Directives (For Healthcare)  Does Patient Have a Medical Advance Directive? No  Would patient like information on creating a medical advance directive? No - Patient declined  Mental Health Advance Directives  Does Patient Have a Mental Health Advance Directive? No  Would patient like information on creating a mental health advance directive? No - Patient declined   Visited briefly with the patient. No needs were stated at this time.   Please, contact Spiritual Care for further assistance.   Chaplain Clint BolderBrittany Ceara Wrightson M.Div., [facility]

## 2018-01-25 ENCOUNTER — Ambulatory Visit: Payer: Self-pay | Attending: Internal Medicine | Admitting: Internal Medicine

## 2018-01-25 ENCOUNTER — Encounter: Payer: Self-pay | Admitting: Internal Medicine

## 2018-01-25 VITALS — BP 122/76 | HR 74 | Temp 98.2°F | Resp 16 | Ht 68.0 in | Wt 194.0 lb

## 2018-01-25 DIAGNOSIS — S82891A Other fracture of right lower leg, initial encounter for closed fracture: Secondary | ICD-10-CM | POA: Insufficient documentation

## 2018-01-25 DIAGNOSIS — R918 Other nonspecific abnormal finding of lung field: Secondary | ICD-10-CM | POA: Insufficient documentation

## 2018-01-25 DIAGNOSIS — F1290 Cannabis use, unspecified, uncomplicated: Secondary | ICD-10-CM | POA: Insufficient documentation

## 2018-01-25 DIAGNOSIS — F17210 Nicotine dependence, cigarettes, uncomplicated: Secondary | ICD-10-CM | POA: Insufficient documentation

## 2018-01-25 DIAGNOSIS — S93402A Sprain of unspecified ligament of left ankle, initial encounter: Secondary | ICD-10-CM | POA: Insufficient documentation

## 2018-01-25 DIAGNOSIS — S62101A Fracture of unspecified carpal bone, right wrist, initial encounter for closed fracture: Secondary | ICD-10-CM | POA: Insufficient documentation

## 2018-01-25 DIAGNOSIS — Z91013 Allergy to seafood: Secondary | ICD-10-CM | POA: Insufficient documentation

## 2018-01-25 DIAGNOSIS — F17200 Nicotine dependence, unspecified, uncomplicated: Secondary | ICD-10-CM | POA: Insufficient documentation
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
## 2018-01-25 NOTE — Patient Instructions (Signed)
Call [facility] [employer] and request nicotine patches

## 2018-01-28 ENCOUNTER — Inpatient Hospital Stay: Payer: Self-pay | Admitting: Critical Care Medicine

## 2018-04-23 ENCOUNTER — Other Ambulatory Visit: Payer: Self-pay

## 2018-04-23 ENCOUNTER — Ambulatory Visit: Payer: Self-pay | Attending: Internal Medicine | Admitting: Internal Medicine

## 2018-04-23 ENCOUNTER — Ambulatory Visit: Payer: Self-pay | Admitting: Internal Medicine

## 2018-04-23 DIAGNOSIS — Z87891 Personal history of nicotine dependence: Secondary | ICD-10-CM

## 2018-04-23 DIAGNOSIS — R918 Other nonspecific abnormal finding of lung field: Secondary | ICD-10-CM
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
## 2018-04-29 ENCOUNTER — Telehealth: Payer: Self-pay | Admitting: Internal Medicine

## 2018-04-29 ENCOUNTER — Telehealth: Payer: Self-pay

## 2018-04-29 NOTE — Telephone Encounter (Signed)
Thank you :)

## 2018-04-29 NOTE — Telephone Encounter (Signed)
Contacted the scheduling department and spoke to Darl Pikes   CT is scheduled for June 21, 2018 at 3pm. Pt is to arrive at 245pm.   Contacted pt to go over appointment information pt didn't;t answer and was unable to lvm due to vm not being set up

## 2018-04-29 NOTE — Telephone Encounter (Signed)
Informed patient of note. Patient expressed understanding.

## 2018-06-21 ENCOUNTER — Other Ambulatory Visit: Payer: Self-pay

## 2018-06-21 ENCOUNTER — Ambulatory Visit (HOSPITAL_COMMUNITY)
Admission: RE | Admit: 2018-06-21 | Discharge: 2018-06-21 | Disposition: A | Discharge status: Home / Self Care | Payer: Self-pay | Source: Ambulatory Visit | Attending: Internal Medicine | Admitting: Internal Medicine

## 2018-06-21 DIAGNOSIS — R918 Other nonspecific abnormal finding of lung field: Secondary | ICD-10-CM | POA: Insufficient documentation

## 2018-06-24 ENCOUNTER — Telehealth: Payer: Self-pay

## 2018-06-24 NOTE — Telephone Encounter (Signed)
Contacted pt to go over CT results pt is aware and doesn't have any questions or concerns  

## 2018-08-12 ENCOUNTER — Other Ambulatory Visit: Payer: Self-pay

## 2018-08-12 ENCOUNTER — Ambulatory Visit: Payer: Self-pay | Attending: Internal Medicine | Admitting: Internal Medicine

## 2018-09-02 ENCOUNTER — Encounter: Payer: Self-pay | Admitting: Internal Medicine

## 2018-09-02 ENCOUNTER — Ambulatory Visit: Payer: Self-pay | Attending: Internal Medicine | Admitting: Internal Medicine

## 2018-09-02 ENCOUNTER — Other Ambulatory Visit: Payer: Self-pay

## 2018-09-02 DIAGNOSIS — F17200 Nicotine dependence, unspecified, uncomplicated: Secondary | ICD-10-CM

## 2018-09-02 DIAGNOSIS — R918 Other nonspecific abnormal finding of lung field: Secondary | ICD-10-CM
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
## 2018-09-24 ENCOUNTER — Telehealth: Payer: Self-pay | Admitting: *Deleted

## 2018-09-24 NOTE — Telephone Encounter (Signed)
!!!  Please schedule a Nurse or CPP appointment for vaccines!!! 

## 2018-12-23 ENCOUNTER — Encounter (HOSPITAL_COMMUNITY): Payer: Self-pay

## 2018-12-23 ENCOUNTER — Other Ambulatory Visit: Payer: Self-pay

## 2018-12-23 ENCOUNTER — Ambulatory Visit (HOSPITAL_COMMUNITY)
Admission: EM | Admit: 2018-12-23 | Discharge: 2018-12-23 | Disposition: A | Discharge status: Home / Self Care | Payer: Self-pay | Attending: Family Medicine | Admitting: Family Medicine

## 2018-12-23 DIAGNOSIS — H1011 Acute atopic conjunctivitis, right eye: Secondary | ICD-10-CM

## 2018-12-23 MED ORDER — OLOPATADINE HCL 0.2 % OP SOLN: 1.0000 [drp] | Freq: Every day | OPHTHALMIC | 0 refills | Status: DC

## 2018-12-23 NOTE — ED Provider Notes (Signed)
[facility]   [id number] 12/23/18 Arrival Time: 0626  ASSESSMENT & PLAN:  1. Allergic conjunctivitis of right eye     See AVS for discharge information.  Begin: Meds ordered this encounter  Medications  . Olopatadine HCl 0.2 % SOLN    Sig: Apply 1 drop to eye daily.    Dispense:  2.5 mL    Refill:  0    Ophthalmic drops per orders. Warm compress to eye(s). Local eye care discussed.  Reviewed expectations re: course of current medical issues. Questions answered. Outlined signs and symptoms indicating need for more acute intervention. Patient verbalized understanding. After Visit Summary given.   SUBJECTIVE:  Anthony Christian is a 38 y.o. male who presents with complaint of persistent R eye "itching and irritation". Onset gradual, over the past week. Occasional and slight watery drainage; eye is matted in the morning. No specific eye pain reported.  Injury/trauma: no. Visual changes: no. Contact lens use: no. Recent illness: no. Self treatment: none reported. No recent illnesses or URI symptoms.  ROS: As per HPI.    OBJECTIVE:  Vitals:   12/23/18 1240 12/23/18 1242  BP:  130/63  Pulse:  61  Resp:  19  Temp:  97.9 F (36.6 C)  TempSrc:  Oral  SpO2:  99%  Weight: 95.3 kg     General appearance: alert; no distress HEENT: Anthony Christian; AT; PERRLA; EOMI OS: normal exam OD: without reported pain; with mild conjunctival injection; without drainage; without corneal opacities; without limbal flush; without periorbital swelling or erythema Neck: supple without LAD Lungs: clear to auscultation bilaterally; unlabored respirations Heart: regular rate and rhythm Skin: warm and dry Psychological: alert and cooperative; normal mood and affect      Allergies  Allergen Reactions  . Shellfish Allergy Anaphylaxis     Social History   Socioeconomic History  . Marital status: Single    Spouse name: Not on file  . Number of children: Not on file  . Years of  education: Not on file  . Highest education level: Not on file  Occupational History  . Not on file  Social Needs  . Financial resource strain: Not on file  . Food insecurity    Worry: Not on file    Inability: Not on file  . Transportation needs    Medical: Not on file    Non-medical: Not on file  Tobacco Use  . Smoking status: Current Every Day Smoker  . Smokeless tobacco: Never Used  Substance and Sexual Activity  . Alcohol use: No  . Drug use: Yes    Types: Marijuana  . Sexual activity: Not on file  Lifestyle  . Physical activity    Days per week: Not on file    Minutes per session: Not on file  . Stress: Not on file  Relationships  . Social Herbalist on phone: Not on file    Gets together: Not on file    Attends religious service: Not on file    Active member of club or organization: Not on file    Attends meetings of clubs or organizations: Not on file    Relationship status: Not on file  . Intimate partner violence    Fear of current or ex partner: Not on file    Emotionally abused: Not on file    Physically abused: Not on file    Forced sexual activity: Not on file  Other Topics Concern  . Not on file  Social  History Narrative  . Not on file   Family History  Problem Relation Age of Onset  . Healthy Mother   . Diabetes Father    History reviewed. No pertinent surgical history.   Mardella Layman, MD 12/23/18 1310

## 2018-12-23 NOTE — ED Triage Notes (Signed)
Pt. States his right eye burns and is itchy, does not know how this developed.

## 2019-04-09 ENCOUNTER — Encounter (HOSPITAL_COMMUNITY): Payer: Self-pay

## 2019-04-09 ENCOUNTER — Ambulatory Visit (HOSPITAL_COMMUNITY)
Admission: EM | Admit: 2019-04-09 | Discharge: 2019-04-09 | Disposition: A | Discharge status: Home / Self Care | Payer: Self-pay | Attending: Family Medicine | Admitting: Family Medicine

## 2019-04-09 ENCOUNTER — Other Ambulatory Visit: Payer: Self-pay

## 2019-04-09 DIAGNOSIS — L089 Local infection of the skin and subcutaneous tissue, unspecified: Secondary | ICD-10-CM

## 2019-04-09 DIAGNOSIS — B9689 Other specified bacterial agents as the cause of diseases classified elsewhere: Secondary | ICD-10-CM

## 2019-04-09 MED ORDER — HYDROCODONE-ACETAMINOPHEN 5-325 MG PO TABS: 1.0000 | ORAL_TABLET | Freq: Four times a day (QID) | ORAL | 0 refills | Status: DC | PRN

## 2019-04-09 MED ORDER — DOXYCYCLINE HYCLATE 100 MG PO CAPS: 100.0000 mg | ORAL_CAPSULE | Freq: Two times a day (BID) | ORAL | 0 refills | Status: DC

## 2019-04-09 NOTE — ED Provider Notes (Signed)
[facility]   [id number] 04/09/19 Arrival Time: 1415  ASSESSMENT & PLAN:  1. Superficial bacterial skin infection     No indication to attempt I&D today.  Meds ordered this encounter  Medications  . doxycycline (VIBRAMYCIN) 100 MG capsule    Sig: Take 1 capsule (100 mg total) by mouth 2 (two) times daily.    Dispense:  14 capsule    Refill:  0  . HYDROcodone-acetaminophen (NORCO/VICODIN) 5-325 MG tablet    Sig: Take 1 tablet by mouth every 6 (six) hours as needed for moderate pain or severe pain.    Dispense:  4 tablet    Refill:  0    Follow-up Information    [facility].   Specialty: Urgent Care Why: If worsening or failing to improve as anticipated. Contact information: [address] [place] [address] [phone]           Reviewed expectations re: course of current medical issues. Questions answered. Outlined signs and symptoms indicating need for more acute intervention. Patient verbalized understanding. After Visit Summary given.   SUBJECTIVE:  Anthony Christian is a 39 y.o. male who presents with a possible infection of his R mid buttock. Onset gradual, first noted two d ago; without active drainage and without active bleeding. Symptoms have gradually worsened since beginning; more painful. Fever: absent. OTC/home treatment: none reported.   OBJECTIVE:  Vitals:   04/09/19 1424 04/09/19 1427  BP: 122/71   Pulse: 73   Resp: 18   Temp: 98.4 F (36.9 C)   TempSrc: Oral   SpO2: 96%   Weight:  93.3 kg     General appearance: alert; no distress Skin: inner R mid buttock with approx 0.5 cm area of erythema; no appreciable induration or fluctuance; no skin thickening Psychological: alert and cooperative; normal mood and affect  Allergies  Allergen Reactions  . Shellfish Allergy Anaphylaxis    History reviewed. No pertinent past medical history. Social History   Socioeconomic  History  . Marital status: Significant Other    Spouse name: Not on file  . Number of children: Not on file  . Years of education: Not on file  . Highest education level: Not on file  Occupational History  . Not on file  Tobacco Use  . Smoking status: Current Every Day Smoker  . Smokeless tobacco: Never Used  Substance and Sexual Activity  . Alcohol use: No  . Drug use: Yes    Types: Marijuana  . Sexual activity: Yes    Birth control/protection: None    Comment: has 1 partner  Other Topics Concern  . Not on file  Social History Narrative  . Not on file   Social Determinants of Health   Financial Resource Strain:   . Difficulty of Paying Living Expenses:   Food Insecurity:   . Worried About Programme researcher, broadcasting/film/video in the Last Year:   . Barista in the Last Year:   Transportation Needs:   . Freight forwarder (Medical):   Marland Kitchen Lack of Transportation (Non-Medical):   Physical Activity:   . Days of Exercise per Week:   . Minutes of Exercise per Session:   Stress:   . Feeling of Stress :   Social Connections:   . Frequency of Communication with Friends and Family:   . Frequency of Social Gatherings with Friends and Family:   . Attends Religious Services:   . Active Member of Clubs  or Organizations:   . Attends Archivist Meetings:   Marland Kitchen Marital Status:    Family History  Problem Relation Age of Onset  . Healthy Mother   . Diabetes Father    History reviewed. No pertinent surgical history.         Vanessa Kick, MD 04/09/19 1453

## 2019-04-09 NOTE — ED Triage Notes (Addendum)
Pt is here with an abscess on his buttocks that started 2 days ago, pt has NOT taken any meds to relieve discomfort.

## 2019-04-09 NOTE — Discharge Instructions (Signed)

## 2019-04-13 ENCOUNTER — Other Ambulatory Visit: Payer: Self-pay

## 2019-04-13 ENCOUNTER — Ambulatory Visit (HOSPITAL_COMMUNITY)
Admission: EM | Admit: 2019-04-13 | Discharge: 2019-04-13 | Disposition: A | Discharge status: Home / Self Care | Payer: Self-pay | Attending: Urgent Care | Admitting: Urgent Care

## 2019-04-13 ENCOUNTER — Encounter (HOSPITAL_COMMUNITY): Payer: Self-pay

## 2019-04-13 DIAGNOSIS — L089 Local infection of the skin and subcutaneous tissue, unspecified: Secondary | ICD-10-CM

## 2019-04-13 HISTORY — DX: Unspecified asthma, uncomplicated: J45.909

## 2019-04-13 MED ORDER — NAPROXEN 500 MG PO TABS: 500.0000 mg | ORAL_TABLET | Freq: Two times a day (BID) | ORAL | 0 refills | Status: DC

## 2019-04-13 MED ORDER — AMOXICILLIN-POT CLAVULANATE 875-125 MG PO TABS: 1.0000 | ORAL_TABLET | Freq: Two times a day (BID) | ORAL | 0 refills | Status: DC

## 2019-04-13 NOTE — ED Provider Notes (Signed)
[facility]   MRN: [id number] DOB: Nov 12, 1980  Subjective:   Anthony Christian is a 39 y.o. male presenting for recheck on persistent right buttock pain.  Patient is concerned about having a recurrent abscess.  He was last seen here on 04/09/2019, found to have slight erythema very close to the anus and started on doxycycline, hydrocodone for pain.  Patient reports that his pain is ongoing but not necessarily worse.  He has previously had to have an I&D but states that he cannot feel the mass or swelling as it happened previously.  Denies fever, nausea, vomiting, pelvic pain, dysuria, constipation.  No current facility-administered medications for this encounter.  Current Outpatient Medications:  .  doxycycline (VIBRAMYCIN) 100 MG capsule, Take 1 capsule (100 mg total) by mouth 2 (two) times daily., Disp: 14 capsule, Rfl: 0 .  HYDROcodone-acetaminophen (NORCO/VICODIN) 5-325 MG tablet, Take 1 tablet by mouth every 6 (six) hours as needed for moderate pain or severe pain., Disp: 4 tablet, Rfl: 0 .  Olopatadine HCl 0.2 % SOLN, Apply 1 drop to eye daily., Disp: 2.5 mL, Rfl: 0   Allergies  Allergen Reactions  . Shellfish Allergy Anaphylaxis    Past Medical History:  Diagnosis Date  . Asthma      History reviewed. No pertinent surgical history.  Family History  Problem Relation Age of Onset  . Healthy Mother   . Diabetes Father     Social History   Tobacco Use  . Smoking status: Former Research scientist (life sciences)  . Smokeless tobacco: Never Used  Substance Use Topics  . Alcohol use: No  . Drug use: Yes    Types: Marijuana    ROS   Objective:   Vitals: BP 128/70   Pulse 69   Temp 98.8 F (37.1 C) (Oral)   Resp 18   Ht 5\' 7"  (1.702 m)   Wt 205 lb (93 kg)   SpO2 98%   BMI 32.11 kg/m   Physical Exam Constitutional:      General: He is not in acute distress.    Appearance: Normal appearance. He is well-developed and normal weight. He is not ill-appearing, toxic-appearing or  diaphoretic.  HENT:     Head: Normocephalic and atraumatic.     Right Ear: External ear normal.     Left Ear: External ear normal.     Nose: Nose normal.     Mouth/Throat:     Pharynx: Oropharynx is clear.  Eyes:     General: No scleral icterus.       Right eye: No discharge.        Left eye: No discharge.     Extraocular Movements: Extraocular movements intact.     Pupils: Pupils are equal, round, and reactive to light.  Cardiovascular:     Rate and Rhythm: Normal rate.  Pulmonary:     Effort: Pulmonary effort is normal.  Genitourinary:   Musculoskeletal:     Cervical back: Normal range of motion.  Neurological:     Mental Status: He is alert and oriented to person, place, and time.  Psychiatric:        Mood and Affect: Mood normal.        Behavior: Behavior normal.        Thought Content: Thought content normal.        Judgment: Judgment normal.      Assessment and Plan :   1. Skin infection     Case discussed with Dr. Meda Coffee.  There is no  obvious sign of abscess or area amenable to incision and drainage.  Patient is to stop doxycycline and will use Augmentin for better anaerobic coverage.  Use naproxen for pain and inflammation.  Reviewed ER precautions, counseled that he may end up needing imaging to rule out an infection tracking into his pelvic cavity. Counseled patient on potential for adverse effects with medications prescribed/recommended today, ER and return-to-clinic precautions discussed, patient verbalized understanding.    Wallis Bamberg, [place] 04/14/19 [phone]

## 2019-04-13 NOTE — ED Triage Notes (Signed)
Pt was here 3-4 days ago for abcess on right buttocks. Pt states the meds are not helping the abcess to come to head. Pt states he's in a lot of pain w/the abcess.

## 2019-04-13 NOTE — Discharge Instructions (Signed)
I would like for you to stop taking doxycycline and switch to Augmentin.  This kind of antibiotic and cover for infections better given the area that we are working with.  I want you to take naproxen for pain and inflammation.  If your symptoms do not improve over the next couple of days and definitely if you get worse, you will need to report to the emergency room as they will have to use imaging to really look internally for a deep space infection.

## 2019-04-15 ENCOUNTER — Encounter (HOSPITAL_COMMUNITY): Payer: Self-pay | Admitting: Emergency Medicine

## 2019-04-15 ENCOUNTER — Other Ambulatory Visit: Payer: Self-pay

## 2019-04-15 ENCOUNTER — Emergency Department (HOSPITAL_COMMUNITY)
Admission: EM | Admit: 2019-04-15 | Discharge: 2019-04-15 | Disposition: A | Discharge status: Home / Self Care | Payer: Self-pay | Attending: Emergency Medicine | Admitting: Emergency Medicine

## 2019-04-15 DIAGNOSIS — Z87891 Personal history of nicotine dependence: Secondary | ICD-10-CM | POA: Insufficient documentation

## 2019-04-15 DIAGNOSIS — K610 Anal abscess: Secondary | ICD-10-CM | POA: Insufficient documentation

## 2019-04-15 DIAGNOSIS — Z79899 Other long term (current) drug therapy: Secondary | ICD-10-CM | POA: Insufficient documentation

## 2019-04-15 MED ORDER — DOCUSATE SODIUM 100 MG PO CAPS: 100.0000 mg | ORAL_CAPSULE | Freq: Two times a day (BID) | ORAL | 0 refills | Status: DC

## 2019-04-15 MED ORDER — LIDOCAINE-EPINEPHRINE (PF) 2 %-1:200000 IJ SOLN
20.0000 mL | Freq: Once | INTRAMUSCULAR | Status: AC
  Administered 2019-04-15: 20 mL
  Filled 2019-04-15: qty 20

## 2019-04-15 NOTE — Discharge Instructions (Addendum)
Use over the counter Dibucaine or Nupercainal ointment (if you can't find it the pharmacist can help you- usually with the hemorrhoid products.) I have ordered a stool softener as well which you can pick up at your pharmacy. Get help right away if you: Have problems moving or using your legs. Have severe or increasing pain. Have swelling in the affected area that suddenly gets worse. Have a large increase in bleeding or passing of pus. Develop chills or a fever.

## 2019-04-15 NOTE — ED Provider Notes (Signed)
[facility] EMERGENCY DEPARTMENT Provider Note   CSN: [id number] Arrival date & time: 04/15/19  0210     History Chief Complaint  Patient presents with  . Abscess    Anthony Christian is a 39 y.o. male.   Abscess Location:  Pelvis Pelvic abscess location:  R buttock Abscess quality: fluctuance, painful and warmth   Red streaking: no   Duration:  8 days Progression:  Worsening Pain details:    Quality:  Pressure and aching Chronicity:  New Context: not diabetes, not immunosuppression, not injected drug use, not insect bite/sting and not skin injury   Relieved by:  Nothing Ineffective treatments:  Oral antibiotics and NSAIDs Associated symptoms: no anorexia, no fatigue, no fever, no headaches, no nausea and no vomiting   Risk factors: prior abscess   Risk factors: no hx of MRSA        Past Medical History:  Diagnosis Date  . Asthma     Patient Active Problem List   Diagnosis Date Noted  . Malleolar fracture, right, closed, initial encounter 01/25/2018  . Closed fracture of right wrist 01/25/2018  . Multiple lung nodules 01/25/2018  . Sprain of left ankle 01/25/2018  . Tobacco dependence 01/25/2018  . Marijuana use 01/25/2018    History reviewed. No pertinent surgical history.     Family History  Problem Relation Age of Onset  . Healthy Mother   . Diabetes Father     Social History   Tobacco Use  . Smoking status: Former Games developer  . Smokeless tobacco: Never Used  Substance Use Topics  . Alcohol use: No  . Drug use: Yes    Types: Marijuana    Home Medications Prior to Admission medications   Medication Sig Start Date End Date Taking? Authorizing Provider  amoxicillin-clavulanate (AUGMENTIN) 875-125 MG tablet Take 1 tablet by mouth 2 (two) times daily. 04/13/19   Wallis Bamberg, PA-C  docusate sodium (COLACE) 100 MG capsule Take 1 capsule (100 mg total) by mouth every 12 (twelve) hours. 04/15/19   Arthor Captain, PA-C    HYDROcodone-acetaminophen (NORCO/VICODIN) 5-325 MG tablet Take 1 tablet by mouth every 6 (six) hours as needed for moderate pain or severe pain. 04/09/19   Mardella Layman, MD  naproxen (NAPROSYN) 500 MG tablet Take 1 tablet (500 mg total) by mouth 2 (two) times daily. 04/13/19   Wallis Bamberg, PA-C  Olopatadine HCl 0.2 % SOLN Apply 1 drop to eye daily. 12/23/18   Mardella Layman, MD    Allergies    Shellfish allergy  Review of Systems   Review of Systems  Constitutional: Negative for fatigue and fever.  Gastrointestinal: Negative for anorexia, nausea and vomiting.  Skin:       abscess  Neurological: Negative for headaches.    Physical Exam Updated Vital Signs BP 119/60   Pulse (!) 52   Temp 98.2 F (36.8 C) (Oral)   Resp 17   Ht 5\' 8"  (1.727 m)   Wt 100 kg   SpO2 94%   BMI 33.52 kg/m   Physical Exam Vitals and nursing note reviewed.  Constitutional:      General: He is not in acute distress.    Appearance: He is well-developed. He is not diaphoretic.  HENT:     Head: Normocephalic and atraumatic.  Eyes:     General: No scleral icterus.    Conjunctiva/sclera: Conjunctivae normal.  Cardiovascular:     Rate and Rhythm: Normal rate and regular rhythm.     Heart sounds:  Normal heart sounds.  Pulmonary:     Effort: Pulmonary effort is normal. No respiratory distress.     Breath sounds: Normal breath sounds.  Abdominal:     Palpations: Abdomen is soft.     Tenderness: There is no abdominal tenderness.  Genitourinary:    Comments: Fluctuant abscess on the r buttock in the perianal region Musculoskeletal:     Cervical back: Normal range of motion and neck supple.  Skin:    General: Skin is warm and dry.  Neurological:     Mental Status: He is alert.  Psychiatric:        Behavior: Behavior normal.     ED Results / Procedures / Treatments   Labs (all labs ordered are listed, but only abnormal results are displayed) Labs Reviewed - No data to  display  EKG None  Radiology No results found.  Procedures .Marland KitchenIncision and Drainage  Date/Time: 04/15/2019 8:08 AM Performed by: Arthor Captain, PA-C Authorized by: Arthor Captain, PA-C   Consent:    Consent obtained:  Verbal   Consent given by:  Patient   Risks discussed:  Bleeding, incomplete drainage, pain and damage to other organs   Alternatives discussed:  No treatment Universal protocol:    Procedure explained and questions answered to patient or proxy's satisfaction: yes     Relevant documents present and verified: yes     Test results available and properly labeled: yes     Imaging studies available: yes     Required blood products, implants, devices, and special equipment available: yes     Site/side marked: yes     Immediately prior to procedure a time out was called: yes     Patient identity confirmed:  Verbally with patient Location:    Type:  Abscess Pre-procedure details:    Skin preparation:  Betadine Anesthesia (see MAR for exact dosages):    Anesthesia method:  Local infiltration   Local anesthetic:  Lidocaine 1% WITH epi Procedure type:    Complexity:  Complex Procedure details:    Incision types:  Cruciate   Incision depth:  Subcutaneous   Scalpel blade:  11   Wound management:  Probed and deloculated, irrigated with saline and extensive cleaning   Drainage:  Purulent   Drainage amount:  Copious   Wound treatment:  Wound left open Post-procedure details:    Patient tolerance of procedure:  Tolerated well, no immediate complications   (including critical care time)  Medications Ordered in ED Medications  lidocaine-EPINEPHrine (XYLOCAINE W/EPI) 2 %-1:200000 (PF) injection 20 mL (has no administration in time range)    ED Course  I have reviewed the triage vital signs and the nursing notes.  Pertinent labs & imaging results that were available during my care of the patient were reviewed by me and considered in my medical decision making (see  chart for details).    MDM Rules/Calculators/A&P                      Tupac Clemon presents with abscess. There is no area of retained pus after procedure. The presentation of Joshua Lawal is NOT consistent with necrotizing fascitis or osteomyolitis. There is no evidence of retained foreign body, neurovascular or tendon injury. The presentation of [place] Oviatt is NOT consistent with sepsis and/or bacteremia. Elmar Petz meets outpatient criteria for treatment  andmay continue his abx. Strict return and follow-up precautions have been given by me personally or by detailed written instructions verbalized by nursing staff  using the teach back method to the patient/family/caregiver(s).  Data Reviewed/Counseling: I have reviewed the patient's vital signs, nursing notes, and other relevant tests/information. I had a detailed discussion regarding the historical points, exam findings, and any diagnostic results supporting the discharge diagnosis. I also discussed the need for outpatient follow-up and the need to return to the ED if symptoms worsen or if there are any questions or concerns that arise at home.  Final Clinical Impression(s) / ED Diagnoses Final diagnoses:  Perianal abscess    Rx / DC Orders ED Discharge Orders         Ordered    docusate sodium (COLACE) 100 MG capsule  Every 12 hours     04/15/19 0811           Margarita Mail, PA-C 04/15/19 2130    Hayden Rasmussen, MD 04/15/19 [phone]

## 2019-04-15 NOTE — ED Triage Notes (Signed)
Patient reports persistent abscess at right inner buttocks for several days , no drainage , denies fever or chills , currently taking oral antibiotic with no improvement .

## 2019-05-29 ENCOUNTER — Other Ambulatory Visit: Payer: Self-pay

## 2019-05-29 ENCOUNTER — Encounter (HOSPITAL_COMMUNITY): Payer: Self-pay

## 2019-05-29 ENCOUNTER — Ambulatory Visit (HOSPITAL_COMMUNITY)
Admission: EM | Admit: 2019-05-29 | Discharge: 2019-05-29 | Disposition: A | Discharge status: Home / Self Care | Payer: HRSA Program | Attending: Family Medicine | Admitting: Family Medicine

## 2019-05-29 DIAGNOSIS — R110 Nausea: Secondary | ICD-10-CM | POA: Diagnosis not present

## 2019-05-29 DIAGNOSIS — J4530 Mild persistent asthma, uncomplicated: Secondary | ICD-10-CM | POA: Diagnosis not present

## 2019-05-29 DIAGNOSIS — Z87891 Personal history of nicotine dependence: Secondary | ICD-10-CM | POA: Diagnosis not present

## 2019-05-29 DIAGNOSIS — H938X3 Other specified disorders of ear, bilateral: Secondary | ICD-10-CM | POA: Insufficient documentation

## 2019-05-29 DIAGNOSIS — U071 COVID-19: Secondary | ICD-10-CM | POA: Insufficient documentation

## 2019-05-29 DIAGNOSIS — R42 Dizziness and giddiness: Secondary | ICD-10-CM | POA: Diagnosis present

## 2019-05-29 DIAGNOSIS — Z91013 Allergy to seafood: Secondary | ICD-10-CM | POA: Diagnosis not present

## 2019-05-29 DIAGNOSIS — Z9109 Other allergy status, other than to drugs and biological substances: Secondary | ICD-10-CM

## 2019-05-29 DIAGNOSIS — Z833 Family history of diabetes mellitus: Secondary | ICD-10-CM | POA: Insufficient documentation

## 2019-05-29 DIAGNOSIS — F1290 Cannabis use, unspecified, uncomplicated: Secondary | ICD-10-CM

## 2019-05-29 DIAGNOSIS — F17200 Nicotine dependence, unspecified, uncomplicated: Secondary | ICD-10-CM

## 2019-05-29 MED ORDER — CETIRIZINE HCL 10 MG PO TABS: 10.0000 mg | ORAL_TABLET | Freq: Every day | ORAL | 0 refills | Status: AC

## 2019-05-29 MED ORDER — ONDANSETRON 8 MG PO TBDP: 8.0000 mg | ORAL_TABLET | Freq: Three times a day (TID) | ORAL | 0 refills | Status: DC | PRN

## 2019-05-29 NOTE — ED Triage Notes (Signed)
Pt reports nasal congestion, lightheadedness and nausea over a week. Pt reports the nausea are worse in the morning. Pt reports he feel lightheaded at any time.

## 2019-05-29 NOTE — ED Provider Notes (Signed)
[facility]   MRN: [id number] DOB: [birth year]  Subjective:   Anthony Christian is a 39 y.o. male presenting for 1 week hx of persistent intermittent dizziness, nausea without vomiting, congestion and lightheadedness. Has used an otc nasal spray, cannot recall what kind. Has a hx of asthma, has been told he has allergies. Smokes Black & Milds, smokes 3 marijuana blunts per day. Has not had COVID vaccination. 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   Uses an albuterol inhaler as needed.  Allergies  Allergen Reactions  . Shellfish Allergy Anaphylaxis    Past Medical History:  Diagnosis Date  . Asthma      No past surgical history on file.  Family History  Problem Relation Age of Onset  . Healthy Mother   . Diabetes Father     Social History   Tobacco Use  . Smoking status: Former Games developer  . Smokeless tobacco: Never Used  Substance Use Topics  . Alcohol use: No  . Drug use: Yes    Types: Marijuana    Review of Systems  Constitutional: Negative for fever and malaise/fatigue.  HENT: Positive for congestion. Negative for ear pain, sinus pain and sore throat.        Bilateral ear fullness.   Eyes: Negative for discharge and redness.  Respiratory: Negative for cough, hemoptysis, shortness of breath and wheezing.   Cardiovascular: Negative for chest pain.  Gastrointestinal: Positive for nausea. Negative for abdominal pain, diarrhea and vomiting.  Genitourinary: Negative for dysuria, flank pain and hematuria.  Musculoskeletal: Negative for myalgias.  Skin: Negative for rash.  Neurological: Positive for dizziness. Negative for weakness and headaches.  Psychiatric/Behavioral: Negative for depression and substance abuse.     Objective:   Vitals: BP 124/76 (BP Location: Right Arm)   Pulse (!) 58   Temp 98.4 F (36.9 C) (Oral)   Resp 16   SpO2 100%   Physical Exam Constitutional:      General: He is not in acute distress.     Appearance: Normal appearance. He is well-developed. He is not ill-appearing, toxic-appearing or diaphoretic.  HENT:     Head: Normocephalic and atraumatic.     Right Ear: External ear normal.     Left Ear: External ear normal.     Nose: Nose normal.     Mouth/Throat:     Mouth: Mucous membranes are moist.     Comments: Post-nasal drainage overlying pharynx.  Eyes:     General: No scleral icterus.       Right eye: No discharge.        Left eye: No discharge.     Extraocular Movements: Extraocular movements intact.     Conjunctiva/sclera: Conjunctivae normal.     Pupils: Pupils are equal, round, and reactive to light.  Cardiovascular:     Rate and Rhythm: Normal rate and regular rhythm.     Heart sounds: Normal heart sounds. No murmur. No friction rub. No gallop.   Pulmonary:     Effort: Pulmonary effort is normal. No respiratory distress.     Breath sounds: Normal breath sounds. No stridor. No wheezing, rhonchi or rales.  Neurological:     Mental Status: He is alert and oriented to person, place, and time.     Cranial Nerves: No cranial nerve deficit.     Motor: No weakness.     Coordination: Coordination normal.     Gait: Gait normal.     Deep Tendon  Reflexes: Reflexes normal.  Psychiatric:        Mood and Affect: Mood normal.        Behavior: Behavior normal.        Thought Content: Thought content normal.        Judgment: Judgment normal.     Assessment and Plan :   PDMP not reviewed this encounter.  1. Dizziness   2. Nausea   3. Ear fullness, bilateral   4. Marijuana use   5. Smoker   6. Environmental allergies   7. Mild persistent asthma without complication     [id number] testing pending, recommended patient start taking Zyrtec for allergies.  Also recommended smoking cessation of both marijuana and Black & Milds.  Counseled on general supportive care.  Provided patient with a note for work.  Will notify of results through [facility]. Counseled patient on  potential for adverse effects with medications prescribed/recommended today, ER and return-to-clinic precautions discussed, patient verbalized understanding.    Jaynee Eagles, PA-C 05/29/19 1556

## 2019-05-30 ENCOUNTER — Telehealth (HOSPITAL_COMMUNITY): Payer: Self-pay

## 2019-05-30 LAB — SARS CORONAVIRUS 2 (TAT 6-24 HRS): SARS Coronavirus 2: POSITIVE — AB

## 2019-05-30 MED ORDER — ONDANSETRON 8 MG PO TBDP: 8.0000 mg | ORAL_TABLET | Freq: Three times a day (TID) | ORAL | 0 refills | Status: AC | PRN

## 2020-01-20 ENCOUNTER — Other Ambulatory Visit: Payer: Self-pay

## 2020-01-20 ENCOUNTER — Ambulatory Visit
Admission: EM | Admit: 2020-01-20 | Discharge: 2020-01-20 | Disposition: A | Discharge status: Home / Self Care | Payer: Self-pay | Attending: Emergency Medicine | Admitting: Emergency Medicine

## 2020-01-20 DIAGNOSIS — J029 Acute pharyngitis, unspecified: Secondary | ICD-10-CM

## 2020-01-20 DIAGNOSIS — Z202 Contact with and (suspected) exposure to infections with a predominantly sexual mode of transmission: Secondary | ICD-10-CM

## 2020-01-20 NOTE — Discharge Instructions (Signed)
Blood work screening for HIV and syphilis Oral swab will screen for oral gonorrhea chlamydia trichomonas Ibuprofen and Tylenol for throat pain May use daily cetirizine/Zyrtec to help with irritation/postnasal drainage Rest and fluids We will call with results if abnormal and provide treatment as needed

## 2020-01-20 NOTE — ED Triage Notes (Signed)
Pt c/o exposure to STD. Pt states he got an anonymus text message stating hs partner has syphilis.Pt states he would like to be tested. Pt states he had a sore throat x 2 days. Pt denies penile discharge and problems urinating.

## 2020-01-20 NOTE — ED Provider Notes (Signed)
[facility]    CSN: [id number] Arrival date & time: 01/20/20  1321      History   Chief Complaint Chief Complaint  Patient presents with  . Exposure to STD  . Chills    HPI Anthony Christian is a 40 y.o. male presenting today for evaluation of STD exposure.  Reports exposure to possible syphilis, informed via anonymous text.  Has had a sore throat for 2 days.  Denies any discharge or dysuria. HPI  Past Medical History:  Diagnosis Date  . Asthma     Patient Active Problem List   Diagnosis Date Noted  . Malleolar fracture, right, closed, initial encounter 01/25/2018  . Closed fracture of right wrist 01/25/2018  . Multiple lung nodules 01/25/2018  . Sprain of left ankle 01/25/2018  . Tobacco dependence 01/25/2018  . Marijuana use 01/25/2018    History reviewed. No pertinent surgical history.     Home Medications    Prior to Admission medications   Medication Sig Start Date End Date Taking? Authorizing Provider  cetirizine (ZYRTEC ALLERGY) 10 MG tablet Take 1 tablet (10 mg total) by mouth daily. 05/29/19   Wallis Bamberg, PA-C  ondansetron (ZOFRAN-ODT) 8 MG disintegrating tablet Take 1 tablet (8 mg total) by mouth every 8 (eight) hours as needed for nausea or vomiting. 05/30/19   Wallis Bamberg, PA-C    Family History Family History  Problem Relation Age of Onset  . Healthy Mother   . Diabetes Father     Social History Social History   Tobacco Use  . Smoking status: Former Games developer  . Smokeless tobacco: Never Used  Substance Use Topics  . Alcohol use: No  . Drug use: Yes    Frequency: 15.0 times per week    Types: Marijuana     Allergies   Shellfish allergy   Review of Systems Review of Systems  Constitutional: Positive for chills. Negative for fever.  HENT: Positive for sore throat.   Respiratory: Negative for shortness of breath.   Cardiovascular: Negative for chest pain.  Gastrointestinal: Negative for abdominal pain, nausea and  vomiting.  Genitourinary: Negative for difficulty urinating, dysuria, frequency, penile discharge, penile pain, penile swelling, scrotal swelling and testicular pain.  Skin: Negative for rash.  Neurological: Negative for dizziness, light-headedness and headaches.     Physical Exam Triage Vital Signs ED Triage Vitals  Enc Vitals Group     BP 01/20/20 1622 134/76     Pulse Rate 01/20/20 1622 77     Resp 01/20/20 1622 16     Temp 01/20/20 1622 98.3 F (36.8 C)     Temp Source 01/20/20 1622 Oral     SpO2 01/20/20 1622 98 %     Weight --      Height --      Head Circumference --      Peak Flow --      Pain Score 01/20/20 1621 0     Pain Loc --      Pain Edu? --      Excl. in GC? --    No data found.  Updated Vital Signs BP 134/76 (BP Location: Left Arm)   Pulse 77   Temp 98.3 F (36.8 C) (Oral)   Resp 16   SpO2 98%   Visual Acuity Right Eye Distance:   Left Eye Distance:   Bilateral Distance:    Right Eye Near:   Left Eye Near:    Bilateral Near:     Physical Exam  Vitals and nursing note reviewed.  Constitutional:      Appearance: He is well-developed and well-nourished.     Comments: No acute distress  HENT:     Head: Normocephalic and atraumatic.     Nose: Nose normal.     Mouth/Throat:     Comments: Mild tonsillar enlargement bilaterally, minimally erythematous, no exudate Eyes:     Conjunctiva/sclera: Conjunctivae normal.  Neck:     Comments: Tonsillar lymphadenopathy bilaterally Cardiovascular:     Rate and Rhythm: Normal rate.  Pulmonary:     Effort: Pulmonary effort is normal. No respiratory distress.  Abdominal:     General: There is no distension.  Musculoskeletal:        General: Normal range of motion.     Cervical back: Neck supple.  Skin:    General: Skin is warm and dry.     Comments: No rash noted  Neurological:     Mental Status: He is alert and oriented to person, place, and time.  Psychiatric:        Mood and Affect: Mood and  affect normal.      UC Treatments / Results  Labs (all labs ordered are listed, but only abnormal results are displayed) Labs Reviewed  HIV ANTIBODY (ROUTINE TESTING W REFLEX)  RPR  CYTOLOGY, (ORAL, ANAL, URETHRAL) ANCILLARY ONLY    EKG   Radiology No results found.  Procedures Procedures (including critical care time)  Medications Ordered in UC Medications - No data to display  Initial Impression / Assessment and Plan / UC Course  I have reviewed the triage vital signs and the nursing notes.  Pertinent labs & imaging results that were available during my care of the patient were reviewed by me and considered in my medical decision making (see chart for details).     Screening for HIV and syphilis with blood work, oral cytology pending with patient declined urethral swab.  Currently asymptomatic.  Questionable source exposure will defer any treatment until results return.  Discussed strict return precautions. Patient verbalized understanding and is agreeable with plan.  Final Clinical Impressions(s) / UC Diagnoses   Final diagnoses:  Possible exposure to STD  Sore throat     Discharge Instructions     Blood work screening for HIV and syphilis Oral swab will screen for oral gonorrhea chlamydia trichomonas Ibuprofen and Tylenol for throat pain May use daily cetirizine/Zyrtec to help with irritation/postnasal drainage Rest and fluids We will call with results if abnormal and provide treatment as needed    ED Prescriptions    None     PDMP not reviewed this encounter.   Lew Dawes, [place] 01/20/20 1752

## 2020-01-20 NOTE — ED Triage Notes (Signed)
Pt states he noticed he had chills this morning.

## 2020-01-21 LAB — HIV ANTIBODY (ROUTINE TESTING W REFLEX): HIV Screen 4th Generation wRfx: NONREACTIVE

## 2020-01-21 LAB — RPR: RPR Ser Ql: NONREACTIVE

## 2020-01-22 LAB — CYTOLOGY, (ORAL, ANAL, URETHRAL) ANCILLARY ONLY
Chlamydia: NEGATIVE
Comment: NEGATIVE
Comment: NEGATIVE
Comment: NORMAL
Neisseria Gonorrhea: NEGATIVE
Trichomonas: NEGATIVE

## 2020-09-10 IMAGING — CT CT CHEST WITHOUT CONTRAST
2 of 3 series · 15 of 36 positions shown, 18 images · non-contrast
Comparison: 01/06/2018

CLINICAL DATA: Follow-up of pulmonary nodule.

EXAM:
CT CHEST WITHOUT CONTRAST
TECHNIQUE: Multidetector CT imaging of the chest was performed following the
standard protocol without IV contrast.

[Series 3: chest w/o 2mm st · axial · non-contrast · 0.79mm/px · z∈[+1142,+1438]mm · 12 of 174 slices shown, 15 images]
[im 13/174  mediastinal]
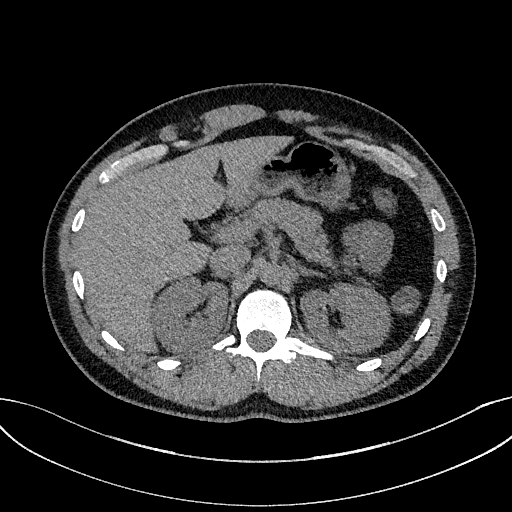
[im 13/174  lung]
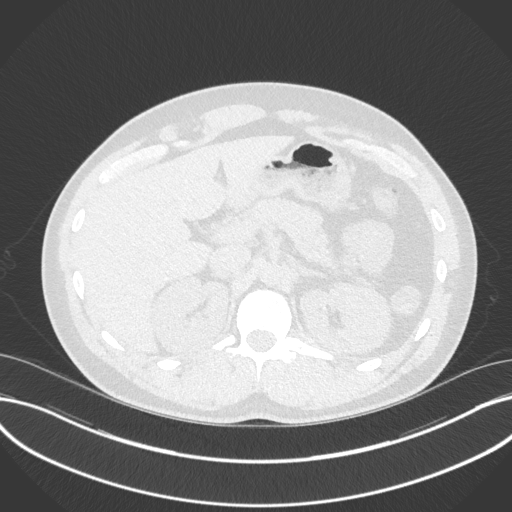
[im 26/174  lung]
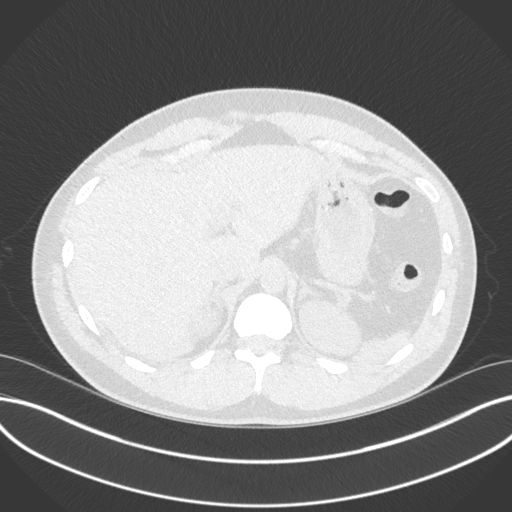
[im 39/174  lung]
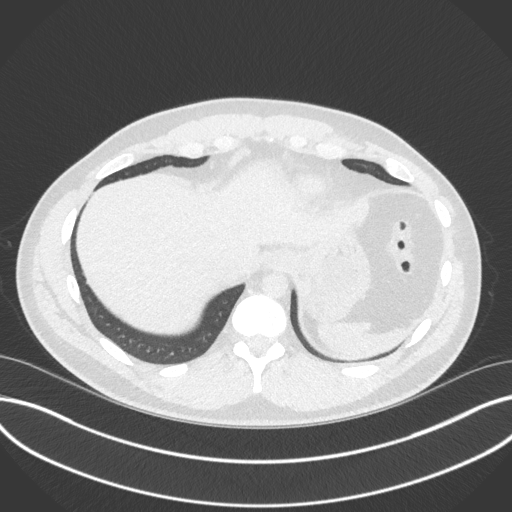
[im 52/174  lung]
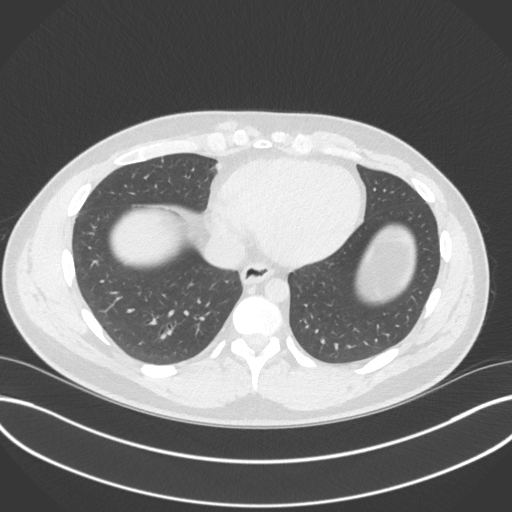
[im 65/174  mediastinal]
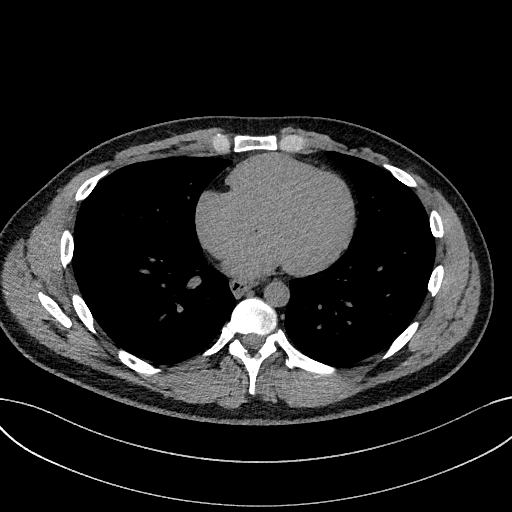
[im 65/174  lung]
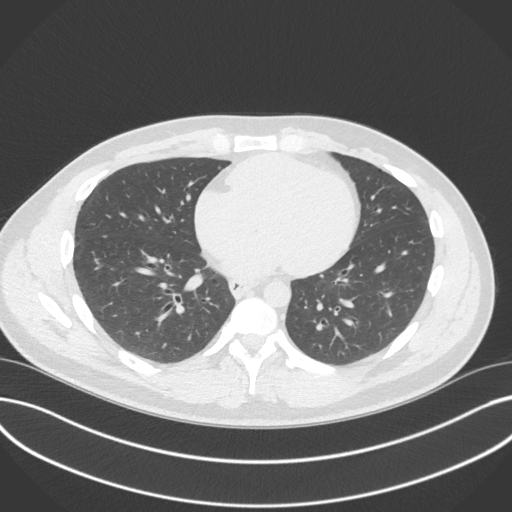
[im 77/174  lung]
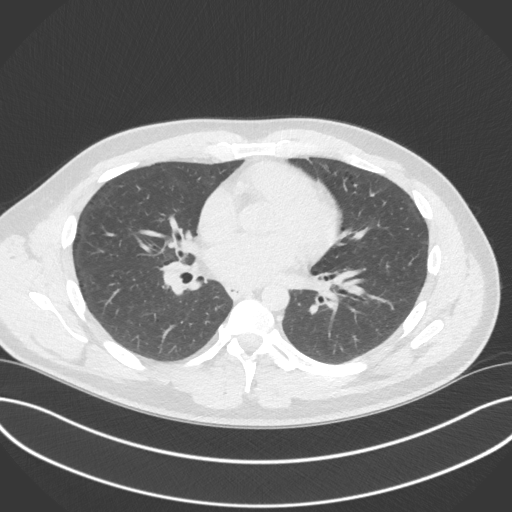
[im 97/174  lung]
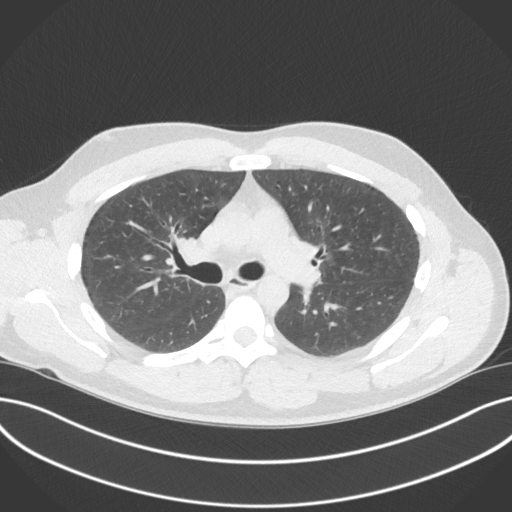
[im 109/174  lung]
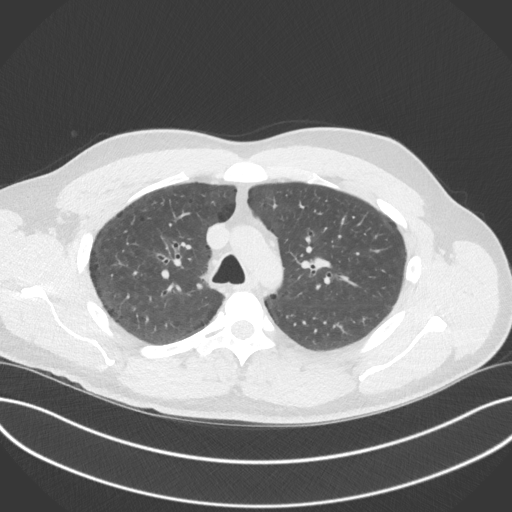
[im 122/174  mediastinal]
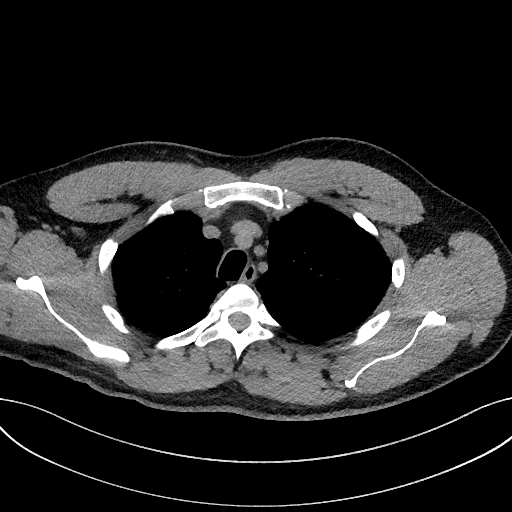
[im 122/174  lung]
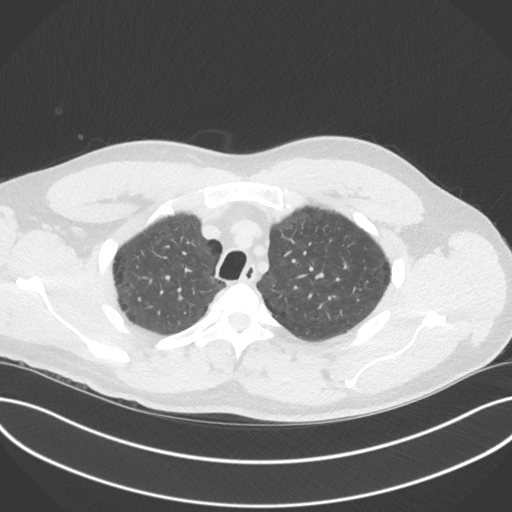
[im 135/174  lung]
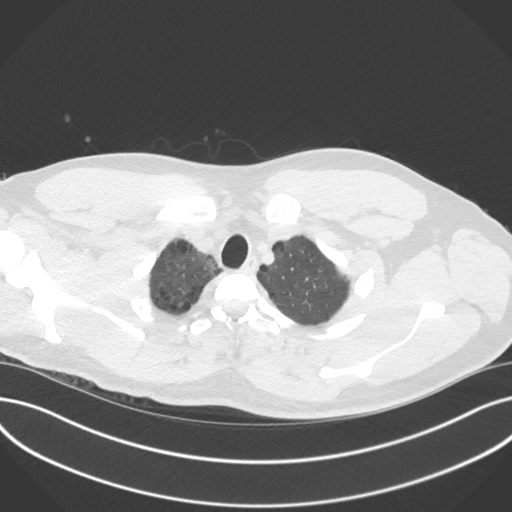
[im 148/174  lung]
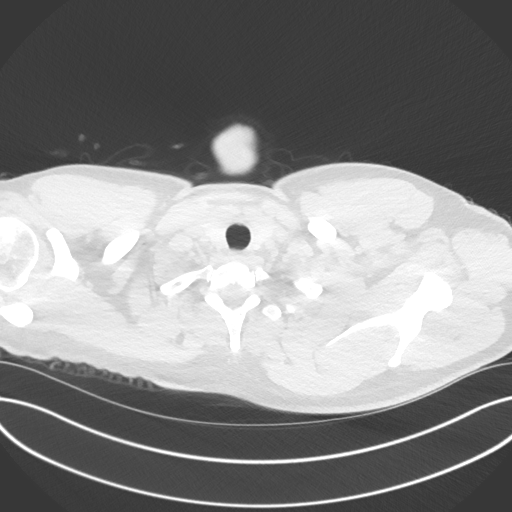
[im 161/174  lung]
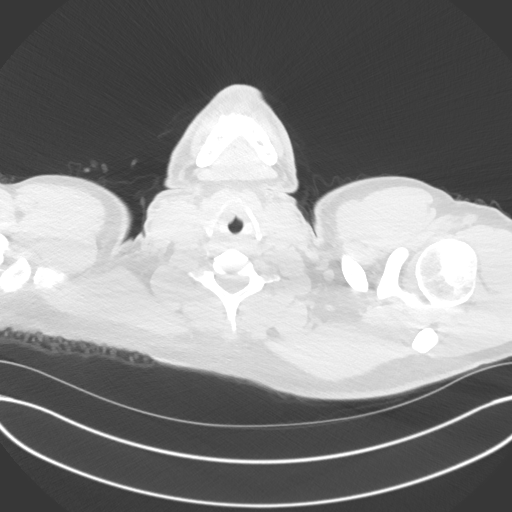

[Series 5: chest w/o 3mm st cor · coronal · non-contrast · 0.67mm/px · 3 of 93 slices shown]
[im 19/93  lung]
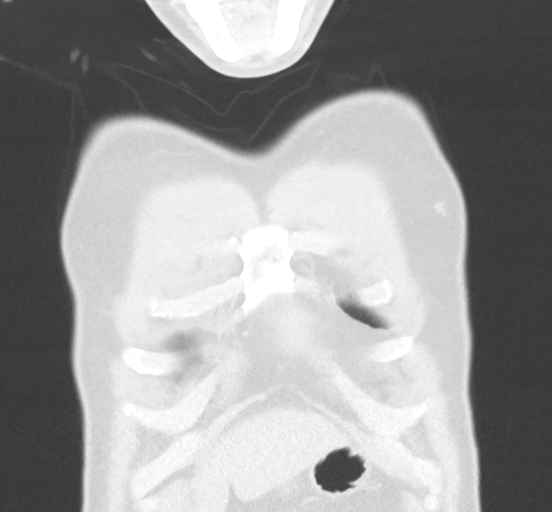
[im 37/93  lung]
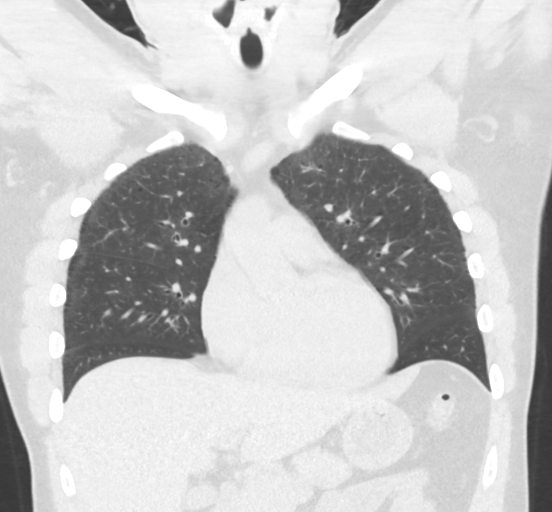
[im 56/93  lung]
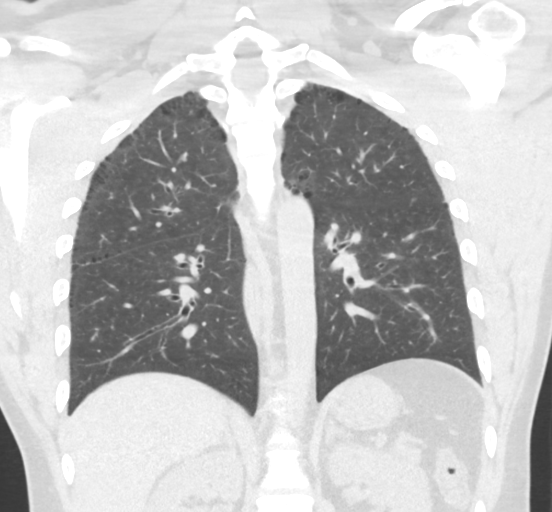

[15 of 36 positions shown; findings below may reference images not displayed]

FINDINGS: Cardiovascular: Normal caliber of the aorta and branch vessels.
Normal heart size, without pericardial effusion.

Mediastinum/Nodes: No mediastinal or definite hilar adenopathy,
given limitations of unenhanced CT.

Lungs/Pleura: No pleural fluid. Mild centrilobular and paraseptal
emphysema.

Multiple primarily right sided and subpleural predominant pulmonary
nodules are again identified and marked on series 4. Examples
including in the right middle lobe at 5 mm on image 116 and right
lower lobe at 5 mm on image 118. No enlarging pulmonary nodules
identified.

Upper Abdomen: Normal imaged portions of the liver, spleen, stomach,
pancreas, adrenal glands, kidneys.

Musculoskeletal: No acute osseous abnormality.
IMPRESSION: 1. Similar appearance of bilateral, primarily right-sided pulmonary
nodules over the past 6 months. Per consensus criteria, given
centrilobular and paraseptal emphysema in this smoker, follow-up at
12-18 months is recommended. This recommendation follows the
consensus statement: Guidelines for Management of Incidental
Pulmonary Nodules Detected on CT Images:From the [HOSPITAL]
4708; published online before print (10.1148/radiol.0901080825).
2.  Emphysema ([id number]-[id number].1).
3.  No acute process in the chest.
